# Patient Record
Sex: Female | Born: 1975
Health system: Southern US, Community
[De-identification: ages and names within clinical notes are randomized; demographics above are authoritative.]

## PROBLEM LIST (undated history)

## (undated) DIAGNOSIS — M47816 Spondylosis without myelopathy or radiculopathy, lumbar region: Secondary | ICD-10-CM

## (undated) DIAGNOSIS — T7840XA Allergy, unspecified, initial encounter: Secondary | ICD-10-CM

## (undated) DIAGNOSIS — D496 Neoplasm of unspecified behavior of brain: Secondary | ICD-10-CM

## (undated) HISTORY — DX: Allergy, unspecified, initial encounter: T78.40XA

## (undated) HISTORY — DX: Spondylosis without myelopathy or radiculopathy, lumbar region: M47.816

## (undated) HISTORY — DX: Neoplasm of unspecified behavior of brain: D49.6

---
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
## 2000-08-13 ENCOUNTER — Other Ambulatory Visit: Admission: RE | Admit: 2000-08-13 | Discharge: 2000-08-13 | Payer: Self-pay | Admitting: *Deleted

## 2001-11-21 ENCOUNTER — Other Ambulatory Visit: Admission: RE | Admit: 2001-11-21 | Discharge: 2001-11-21 | Payer: Self-pay | Admitting: Obstetrics & Gynecology

## 2003-01-03 DIAGNOSIS — D496 Neoplasm of unspecified behavior of brain: Secondary | ICD-10-CM

## 2003-01-03 HISTORY — PX: BRAIN SURGERY: SHX531

## 2003-01-03 HISTORY — DX: Neoplasm of unspecified behavior of brain: D49.6

## 2003-01-03 HISTORY — PX: OTHER SURGICAL HISTORY: SHX169

## 2007-01-03 HISTORY — PX: EXPLORATORY LAPAROTOMY: SUR591

## 2009-01-02 DIAGNOSIS — M47816 Spondylosis without myelopathy or radiculopathy, lumbar region: Secondary | ICD-10-CM

## 2009-01-02 HISTORY — DX: Spondylosis without myelopathy or radiculopathy, lumbar region: M47.816

## 2010-07-07 ENCOUNTER — Emergency Department (HOSPITAL_COMMUNITY)
Admission: EM | Admit: 2010-07-07 | Discharge: 2010-07-07 | Disposition: A | Discharge status: Home / Self Care | Payer: Self-pay | Attending: Emergency Medicine | Admitting: Emergency Medicine

## 2010-07-07 ENCOUNTER — Emergency Department (HOSPITAL_COMMUNITY): Payer: Self-pay

## 2010-07-07 DIAGNOSIS — G9389 Other specified disorders of brain: Secondary | ICD-10-CM | POA: Insufficient documentation

## 2010-07-07 DIAGNOSIS — M549 Dorsalgia, unspecified: Secondary | ICD-10-CM | POA: Insufficient documentation

## 2010-07-07 DIAGNOSIS — R51 Headache: Secondary | ICD-10-CM | POA: Insufficient documentation

## 2010-07-07 LAB — URINALYSIS, ROUTINE W REFLEX MICROSCOPIC
Bilirubin Urine: NEGATIVE
Glucose, UA: NEGATIVE mg/dL
Ketones, ur: NEGATIVE mg/dL
Leukocytes, UA: NEGATIVE
Nitrite: NEGATIVE
Protein, ur: NEGATIVE mg/dL
Specific Gravity, Urine: 1.026 (ref 1.005–1.030)
Urobilinogen, UA: 1 mg/dL (ref 0.0–1.0)
pH: 6.5 (ref 5.0–8.0)

## 2010-07-07 LAB — CBC
HCT: 36.1 % (ref 36.0–46.0)
Hemoglobin: 11.9 g/dL — ABNORMAL LOW (ref 12.0–15.0)
MCV: 80.4 fL (ref 78.0–100.0)
RDW: 15 % (ref 11.5–15.5)
WBC: 7.4 10*3/uL (ref 4.0–10.5)

## 2010-07-07 LAB — URINE MICROSCOPIC-ADD ON

## 2010-07-07 LAB — POCT I-STAT, CHEM 8
BUN: 20 mg/dL (ref 6–23)
Calcium, Ion: 1.21 mmol/L (ref 1.12–1.32)
Chloride: 111 meq/L (ref 96–112)
Creatinine, Ser: 1 mg/dL (ref 0.50–1.10)
Glucose, Bld: 89 mg/dL (ref 70–99)
HCT: 37 % (ref 36.0–46.0)
Hemoglobin: 12.6 g/dL (ref 12.0–15.0)
Potassium: 4.1 meq/L (ref 3.5–5.1)
Sodium: 139 meq/L (ref 135–145)
TCO2: 19 mmol/L (ref 0–100)

## 2010-07-07 LAB — DIFFERENTIAL
Basophils Absolute: 0 10*3/uL (ref 0.0–0.1)
Basophils Relative: 0 % (ref 0–1)
Eosinophils Absolute: 0.1 10*3/uL (ref 0.0–0.7)
Eosinophils Relative: 2 % (ref 0–5)
Lymphocytes Relative: 37 % (ref 12–46)
Lymphs Abs: 2.7 10*3/uL (ref 0.7–4.0)
Monocytes Absolute: 0.5 10*3/uL (ref 0.1–1.0)
Monocytes Relative: 7 % (ref 3–12)
Neutro Abs: 4 10*3/uL (ref 1.7–7.7)
Neutrophils Relative %: 54 % (ref 43–77)

## 2010-07-07 LAB — POCT PREGNANCY, URINE: Preg Test, Ur: NEGATIVE

## 2010-07-13 ENCOUNTER — Emergency Department (HOSPITAL_COMMUNITY)
Admission: EM | Admit: 2010-07-13 | Discharge: 2010-07-14 | Disposition: A | Discharge status: Home / Self Care | Payer: Self-pay | Attending: Emergency Medicine | Admitting: Emergency Medicine

## 2010-07-13 ENCOUNTER — Emergency Department (HOSPITAL_COMMUNITY): Payer: Self-pay

## 2010-07-13 DIAGNOSIS — M545 Low back pain, unspecified: Secondary | ICD-10-CM | POA: Insufficient documentation

## 2010-07-13 DIAGNOSIS — R51 Headache: Secondary | ICD-10-CM | POA: Insufficient documentation

## 2010-07-13 DIAGNOSIS — M5126 Other intervertebral disc displacement, lumbar region: Secondary | ICD-10-CM | POA: Insufficient documentation

## 2010-07-13 DIAGNOSIS — G9389 Other specified disorders of brain: Secondary | ICD-10-CM | POA: Insufficient documentation

## 2010-07-13 DIAGNOSIS — M542 Cervicalgia: Secondary | ICD-10-CM | POA: Insufficient documentation

## 2010-07-13 LAB — DIFFERENTIAL
Basophils Absolute: 0 10*3/uL (ref 0.0–0.1)
Basophils Relative: 0 % (ref 0–1)
Eosinophils Absolute: 0.3 10*3/uL (ref 0.0–0.7)
Eosinophils Relative: 4 % (ref 0–5)
Lymphocytes Relative: 37 % (ref 12–46)
Lymphs Abs: 3 K/uL (ref 0.7–4.0)
Monocytes Absolute: 0.6 K/uL (ref 0.1–1.0)
Monocytes Relative: 7 % (ref 3–12)
Neutro Abs: 4.1 10*3/uL (ref 1.7–7.7)
Neutrophils Relative %: 52 % (ref 43–77)

## 2010-07-13 LAB — CBC
HCT: 36 % — ABNORMAL LOW (ref 39.0–52.0)
Hemoglobin: 11.7 g/dL — ABNORMAL LOW (ref 13.0–17.0)
MCH: 26.1 pg (ref 26.0–34.0)
MCHC: 32.5 g/dL (ref 30.0–36.0)
MCV: 80.4 fL (ref 78.0–100.0)
Platelets: 259 10*3/uL (ref 150–400)
RBC: 4.48 MIL/uL (ref 4.22–5.81)
RDW: 15.3 % (ref 11.5–15.5)
WBC: 8 10*3/uL (ref 4.0–10.5)

## 2010-07-13 LAB — BASIC METABOLIC PANEL
CO2: 23 mEq/L (ref 19–32)
Calcium: 9.4 mg/dL (ref 8.4–10.5)
Glucose, Bld: 83 mg/dL (ref 70–99)
Potassium: 4.4 mEq/L (ref 3.5–5.1)
Sodium: 137 mEq/L (ref 135–145)

## 2010-07-13 LAB — BASIC METABOLIC PANEL WITH GFR
BUN: 14 mg/dL (ref 6–23)
Chloride: 105 meq/L (ref 96–112)
Creatinine, Ser: 0.78 mg/dL (ref 0.50–1.35)
GFR calc Af Amer: >60 mL/min (ref >60)
GFR calc non Af Amer: >60 mL/min (ref >60)

## 2010-07-13 MED ORDER — GADOBENATE DIMEGLUMINE 529 MG/ML IV SOLN
15.0000 mL | Freq: Once | INTRAVENOUS | Status: AC | PRN
  Administered 2010-07-13: 15 mL via INTRAVENOUS

## 2010-09-04 ENCOUNTER — Emergency Department (HOSPITAL_COMMUNITY)
Admission: EM | Admit: 2010-09-04 | Discharge: 2010-09-04 | Disposition: A | Discharge status: Home / Self Care | Payer: Self-pay | Attending: Emergency Medicine | Admitting: Emergency Medicine

## 2010-09-04 DIAGNOSIS — H6690 Otitis media, unspecified, unspecified ear: Secondary | ICD-10-CM | POA: Insufficient documentation

## 2010-09-04 DIAGNOSIS — Z85841 Personal history of malignant neoplasm of brain: Secondary | ICD-10-CM | POA: Insufficient documentation

## 2010-09-04 DIAGNOSIS — H9209 Otalgia, unspecified ear: Secondary | ICD-10-CM | POA: Insufficient documentation

## 2011-02-14 ENCOUNTER — Encounter: Payer: Self-pay | Admitting: Family Medicine

## 2011-02-14 ENCOUNTER — Ambulatory Visit (INDEPENDENT_AMBULATORY_CARE_PROVIDER_SITE_OTHER): Payer: Self-pay | Admitting: Family Medicine

## 2011-02-14 DIAGNOSIS — M5126 Other intervertebral disc displacement, lumbar region: Secondary | ICD-10-CM

## 2011-02-14 DIAGNOSIS — M545 Low back pain: Secondary | ICD-10-CM

## 2011-02-14 DIAGNOSIS — D496 Neoplasm of unspecified behavior of brain: Secondary | ICD-10-CM | POA: Insufficient documentation

## 2011-02-14 MED ORDER — TRAMADOL HCL 50 MG PO TABS: 50.0000 mg | ORAL_TABLET | Freq: Four times a day (QID) | ORAL | Status: AC | PRN

## 2011-02-14 MED ORDER — CYCLOBENZAPRINE HCL 10 MG PO TABS: 10.0000 mg | ORAL_TABLET | Freq: Three times a day (TID) | ORAL | Status: AC | PRN

## 2011-02-14 MED ORDER — PREDNISONE 10 MG PO TABS: ORAL_TABLET | ORAL | Status: DC

## 2011-02-14 NOTE — Patient Instructions (Signed)
Primary Care Providers  [facility] - [phone]  [facility]- [phone]  [facility] - [phone]

## 2011-02-14 NOTE — Progress Notes (Signed)
Patient Name: Sandra Hahn Date of Birth: 24-Aug-1975 Medical Record Number: [id number] Gender: female  PCP: No primary provider on file.  History of Present Illness:  Sandra Hahn is a 36 y.o. very pleasant female patient with a history of a cerebellar tumor dx and surgically resected in 2005 who presents with the following: Back Pain  ongoing for approximately: 3-4 years The patient has had back pain before. Ongoing problem intermittently since around 2008, with varying amount of symptoms The back pain is localized into the lumbar spine area. They also describe posterior leg radiculopathy to her foot.  Rare B foot numbness. No bowel or bladder incontinence. No focal weakness. Prior interventions: none Physical therapy: No Chiropractic manipulations: No Acupuncture: No Osteopathic manipulation: No Heat or cold: Minimal effect  Patient Active Problem List  Diagnoses  . Cerebellar tumor   Past Medical History  Diagnosis Date  . Cerebellar tumor 02/14/2011   Past Surgical History  Procedure Date  . Brain surgery 2005    Cerebellar tumor resection   History  Substance Use Topics  . Smoking status: Never Smoker   . Smokeless tobacco: Never Used  . Alcohol Use: Not on file   No family history on file. No Known Allergies No current outpatient prescriptions on file prior to visit.     Past Medical History, Surgical History, Family History, Medications, Allergies have been reviewed and updated if relevant.  Review of Systems  GEN: No fevers, chills. Nontoxic. Primarily MSK c/o today. MSK: Detailed in the HPI GI: tolerating PO intake without difficulty Neuro: As above  Otherwise the pertinent positives of the ROS are noted above.    Physical Exam  Filed Vitals:   02/14/11 1407  BP: 111/77  Pulse: 83  Height: 5\' 6"  (1.676 m)  Weight: 160 lb (72.576 kg)    Gen: Well-developed,well-nourished,in no acute distress; alert,appropriate and cooperative throughout  examination HEENT: Normocephalic and atraumatic without obvious abnormalities.  Ears, externally no deformities Pulm: Breathing comfortably in no respiratory distress Range of motion at  the waist: Flexion, rotation and lateral bending: the patient is able to flex approximately 50 at the waist. Mild impairment with extension. Lateral bending approximately 35 to the RIGHT and to the LEFT.  No echymosis or edema Rises to examination table with no difficulty Gait: minimally antalgic  Inspection/Deformity: No abnormality Paraspinus T:  Mild tenderness  B Ankle Dorsiflexion (L5,4): 5/5 B Great Toe Dorsiflexion (L5,4): 5/5 Heel Walk (L5): WNL Toe Walk (S1): WNL Rise/Squat (L4): WNL, mild pain  SENSORY B Medial Foot (L4): WNL B Dorsum (L5): WNL B Lateral (S1): WNL Light Touch: WNL Pinprick: WNL  REFLEXES Knee (L4): 2+ Ankle (S1): 2+  B SLR, seated: neg B SLR, supine: neg B FABER: neg B Reverse FABER: neg B Greater Troch: NT B Log Roll: neg B Stork: NT B Sciatic Notch: NT  Clinical Data:  History of cerebellar tumor.  Weakness.  New onset of headache and dizziness.  Pressure feeling in back of head and pain throughout the entire spine.   MRI HEAD WITHOUT AND WITH CONTRAST   Technique:  Multiplanar, multiecho pulse sequences of the brain and surrounding structures were obtained without and with intravenous contrast.   Contrast: 15 ml Multihance.   Comparison:  None.   Findings:  The patient is status post suboccipital craniotomy for resection of the right cerebellar tumor.  Right cerebellar encephalomalacia is noted.  No pathologic enhancement is present to suggest residual or recurrent tumor.  No significant supratentorial enhancement  is present.  No hemorrhage or mass lesion is present. There is no acute infarct.   Flow is present in the major intracranial arteries.  The globes orbits are intact.  The paranasal sinuses and mastoid air cells are clear.     IMPRESSION:   1.  Encephalomalacia of the right cerebellum, resection of tumor. 2.  No evidence for residual or recurrent tumor. 3.  No other acute abnormality.   *RADIOLOGY REPORT*   Clinical Data:  Weakness.  Status post resection of cerebellar tumor.  New onset headache and dizziness.  Pressure to the back of head.  Pain throughout the entire spine.   MRI CERVICAL, THORACIC AND LUMBAR SPINE WITHOUT AND WITH CONTRAST   Technique:  Multiplanar and multiecho pulse sequences of the cervical spine, to include the craniocervical junction and cervicothoracic junction, and thoracic and lumbar spine, were obtained without and with intravenous contrast.   Contrast: 15 ml Multihance.   Comparison:  None.   MRI CERVICAL SPINE   Findings:  Normal signal is present in the thoracic spinal cord. Marrow signal, vertebral body heights, and alignment are normal. The postcontrast images demonstrate no pathologic enhancement.  No significant disc herniation or stenosis is present.   IMPRESSION: Negative cervical spine.   MRI THORACIC SPINE   Findings: Normal signal is present in the thoracic spinal cord. There is fatty infiltration along the inferior aspect of T11 without to associated enhancement. Marrow signal, vertebral body heights, and alignment are otherwise normal.  No significant disc herniation or focal stenosis is present.  The postcontrast images demonstrate no pathologic enhancement.   IMPRESSION: Negative thoracic spine.   MRI LUMBAR SPINE   Findings: Normal signal is present in the conus medullaris which terminates at L1, within normal limits.  Marrow signal, vertebral body heights, and alignment are normal.  A left paracentral disc protrusion is present at L5-S1.  This may contact the  S1 nerve root bilaterally, more likely on the left.  The foramina are patent.  The disc levels at L4-5 and above are normal.  No other significant stenosis is present.   Post  contrast images demonstrate no pathologic enhancement.   IMPRESSION:   1.  Left paracentral disc protrusion at L5-S1 with potential contact of the S1 nerve roots bilaterally, more likely on the left. 2.  Otherwise normal MRI of the lumbar spine.   Original Report Authenticated By: Jamesetta Orleans. MATTERN, M.D.   Assessment and Plan:  Back pain with radiculopathy  1. Lumbar disc herniation  predniSONE (DELTASONE) 10 MG tablet, cyclobenzaprine (FLEXERIL) 10 MG tablet, traMADol (ULTRAM) 50 MG tablet, Ambulatory referral to Physical Therapy  2. Low back pain  predniSONE (DELTASONE) 10 MG tablet, cyclobenzaprine (FLEXERIL) 10 MG tablet, traMADol (ULTRAM) 50 MG tablet, Ambulatory referral to Physical Therapy  3. Cerebellar tumor      Long burst and taper of prednisone. Flexeril at night. Tramadol p.r.n. Pain.  The patient is going to obtain her [facility] card, and then we can initiate physical therapy.  She certainly has a lumbar spine disc herniation. Visualize the lumbar spine MRI at the L5-S1 level.

## 2011-03-07 ENCOUNTER — Ambulatory Visit: Payer: Self-pay | Attending: Sports Medicine | Admitting: Physical Therapy

## 2011-03-07 DIAGNOSIS — M545 Low back pain, unspecified: Secondary | ICD-10-CM | POA: Insufficient documentation

## 2011-03-07 DIAGNOSIS — IMO0001 Reserved for inherently not codable concepts without codable children: Secondary | ICD-10-CM | POA: Insufficient documentation

## 2011-03-14 ENCOUNTER — Ambulatory Visit: Payer: Self-pay | Admitting: Physical Therapy

## 2011-03-16 ENCOUNTER — Ambulatory Visit: Payer: Self-pay | Admitting: Physical Therapy

## 2011-03-21 ENCOUNTER — Ambulatory Visit: Payer: Self-pay | Admitting: Physical Therapy

## 2011-03-23 ENCOUNTER — Ambulatory Visit: Payer: Self-pay | Admitting: Physical Therapy

## 2011-03-27 ENCOUNTER — Ambulatory Visit: Payer: Self-pay | Admitting: Rehabilitation

## 2011-03-28 ENCOUNTER — Encounter: Payer: Self-pay | Admitting: Physical Therapy

## 2011-03-30 ENCOUNTER — Encounter: Payer: Self-pay | Admitting: Physical Therapy

## 2011-04-04 ENCOUNTER — Ambulatory Visit: Payer: Self-pay | Attending: Sports Medicine | Admitting: Physical Therapy

## 2011-04-04 DIAGNOSIS — M545 Low back pain, unspecified: Secondary | ICD-10-CM | POA: Insufficient documentation

## 2011-04-04 DIAGNOSIS — IMO0001 Reserved for inherently not codable concepts without codable children: Secondary | ICD-10-CM | POA: Insufficient documentation

## 2011-04-06 ENCOUNTER — Ambulatory Visit: Payer: Self-pay | Admitting: Physical Therapy

## 2011-04-12 ENCOUNTER — Ambulatory Visit: Payer: Self-pay | Admitting: Physical Therapy

## 2011-04-18 ENCOUNTER — Ambulatory Visit: Payer: Self-pay | Admitting: Physical Therapy

## 2011-04-26 ENCOUNTER — Encounter: Payer: Self-pay | Admitting: Physical Therapy

## 2011-05-11 ENCOUNTER — Encounter (HOSPITAL_COMMUNITY): Payer: Self-pay | Admitting: *Deleted

## 2011-05-11 ENCOUNTER — Emergency Department (INDEPENDENT_AMBULATORY_CARE_PROVIDER_SITE_OTHER)
Admission: EM | Admit: 2011-05-11 | Discharge: 2011-05-11 | Disposition: A | Discharge status: Home / Self Care | Payer: Self-pay | Source: Home / Self Care | Attending: Family Medicine | Admitting: Family Medicine

## 2011-05-11 DIAGNOSIS — J302 Other seasonal allergic rhinitis: Secondary | ICD-10-CM

## 2011-05-11 DIAGNOSIS — J309 Allergic rhinitis, unspecified: Secondary | ICD-10-CM

## 2011-05-11 MED ORDER — FEXOFENADINE HCL 180 MG PO TABS: 180.0000 mg | ORAL_TABLET | Freq: Every day | ORAL | Status: DC

## 2011-05-11 MED ORDER — METHYLPREDNISOLONE ACETATE 80 MG/ML IJ SUSP
INTRAMUSCULAR | Status: AC
  Filled 2011-05-11: qty 1

## 2011-05-11 MED ORDER — METHYLPREDNISOLONE ACETATE 40 MG/ML IJ SUSP
80.0000 mg | Freq: Once | INTRAMUSCULAR | Status: AC
  Administered 2011-05-11: 80 mg via INTRAMUSCULAR

## 2011-05-11 MED ORDER — FLUTICASONE PROPIONATE 50 MCG/ACT NA SUSP: 1.0000 | Freq: Two times a day (BID) | NASAL | Status: DC

## 2011-05-11 MED ORDER — OLOPATADINE HCL 0.2 % OP SOLN: 1.0000 [drp] | Freq: Two times a day (BID) | OPHTHALMIC | Status: DC

## 2011-05-11 NOTE — ED Notes (Signed)
States when she takes Zyrtec she feels better

## 2011-05-11 NOTE — ED Notes (Signed)
Pt states for about a month with sinus pressure,headache, scratchy throat, eyes itchy and feels like sand is in her eyes.  States she feels achy and tired at night.

## 2011-05-11 NOTE — ED Provider Notes (Signed)
History     CSN: [id number]  Arrival date & time 05/11/11  1504   First MD Initiated Contact with Patient 05/11/11 1610      Chief Complaint  Patient presents with  . Allergies    (Consider location/radiation/quality/duration/timing/severity/associated sxs/prior treatment) Patient is a 36 y.o. female presenting with URI. The history is provided by the patient.  URI The primary symptoms include cough. Primary symptoms do not include fever, wheezing, abdominal pain, nausea, vomiting or rash. The current episode started more than 1 week ago. This is a new problem. The problem has been gradually worsening.  Associated with: seasonal weather change. Symptoms associated with the illness include congestion and rhinorrhea. The illness is not associated with chills.    Past Medical History  Diagnosis Date  . Cerebellar tumor 02/14/2011    Past Surgical History  Procedure Date  . Brain surgery 2005    Cerebellar tumor resection    No family history on file.  History  Substance Use Topics  . Smoking status: Former Games developer  . Smokeless tobacco: Never Used  . Alcohol Use: No    OB History    Grav Para Term Preterm Abortions TAB SAB Ect Mult Living                  Review of Systems  Constitutional: Negative for fever and chills.  HENT: Positive for congestion, rhinorrhea and postnasal drip.   Respiratory: Positive for cough. Negative for wheezing.   Gastrointestinal: Negative for nausea, vomiting and abdominal pain.  Skin: Negative for rash.    Allergies  Review of patient's allergies indicates no known allergies.  Home Medications   Current Outpatient Rx  Name Route Sig Dispense Refill  . CETIRIZINE HCL 10 MG PO TABS Oral Take 10 mg by mouth daily.    Marland Kitchen PRESCRIPTION MEDICATION  1 day or 1 dose.    Marland Kitchen FEXOFENADINE HCL 180 MG PO TABS Oral Take 1 tablet (180 mg total) by mouth daily. 30 tablet 1  . FLUTICASONE PROPIONATE 50 MCG/ACT NA SUSP Nasal Place 1 spray into the  nose 2 (two) times daily. 1 g 2  . PROVERA PO Oral Take 1 tablet by mouth daily.    Marland Kitchen NAPROXEN SODIUM 220 MG PO TABS Oral Take 220 mg by mouth 2 (two) times daily as needed.    . OLOPATADINE HCL 0.2 % OP SOLN Ophthalmic Apply 1 drop to eye 2 (two) times daily. 2.5 mL 0  . PREDNISONE 10 MG PO TABS  4 tabs po daily x 6 days, then 3 tabs po daily x 4 days, then 2 tabs po daily x 4 days, then 1 tab po daily x 4 days 48 tablet 0    BP 117/76  Pulse 92  Temp(Src) 98.3 F (36.8 C) (Oral)  SpO2 100%  LMP 04/25/2011  Physical Exam  Nursing note and vitals reviewed. Constitutional: She is oriented to person, place, and time. She appears well-developed and well-nourished.  HENT:  Right Ear: External ear normal.  Left Ear: External ear normal.  Nose: Mucosal edema and rhinorrhea present.  Mouth/Throat: Oropharynx is clear and moist.  Eyes: Conjunctivae and EOM are normal. Pupils are equal, round, and reactive to light.  Neck: Normal range of motion. Neck supple.  Cardiovascular: Normal rate, regular rhythm, normal heart sounds and intact distal pulses.   Pulmonary/Chest: Breath sounds normal.  Lymphadenopathy:    She has no cervical adenopathy.  Neurological: She is alert and oriented to person, place, and  time.  Skin: Skin is warm and dry.    ED Course  Procedures (including critical care time)  Labs Reviewed - No data to display No results found.   1. Seasonal allergies       MDM          Linna Hoff, MD 05/11/11 [phone]

## 2011-07-04 ENCOUNTER — Ambulatory Visit (INDEPENDENT_AMBULATORY_CARE_PROVIDER_SITE_OTHER): Payer: Self-pay | Admitting: Family Medicine

## 2011-07-04 ENCOUNTER — Encounter: Payer: Self-pay | Admitting: Family Medicine

## 2011-07-04 VITALS — BP 114/78 | HR 103 | Temp 98.9°F | Ht 66.0 in | Wt 183.0 lb

## 2011-07-04 DIAGNOSIS — G44229 Chronic tension-type headache, not intractable: Secondary | ICD-10-CM

## 2011-07-04 DIAGNOSIS — H60399 Other infective otitis externa, unspecified ear: Secondary | ICD-10-CM

## 2011-07-04 DIAGNOSIS — H6092 Unspecified otitis externa, left ear: Secondary | ICD-10-CM

## 2011-07-04 DIAGNOSIS — M5126 Other intervertebral disc displacement, lumbar region: Secondary | ICD-10-CM

## 2011-07-04 DIAGNOSIS — H6090 Unspecified otitis externa, unspecified ear: Secondary | ICD-10-CM | POA: Insufficient documentation

## 2011-07-04 MED ORDER — HYDROCORTISONE-ACETIC ACID 1-2 % OT SOLN: 4.0000 [drp] | Freq: Three times a day (TID) | OTIC | Status: AC

## 2011-07-04 MED ORDER — ACETAMINOPHEN 500 MG PO TABS: 1000.0000 mg | ORAL_TABLET | Freq: Four times a day (QID) | ORAL | Status: AC | PRN

## 2011-07-04 NOTE — Patient Instructions (Addendum)
Sandra Hahn,  Thank you for coming in today.  For headache: You may treat with naproxen or tylenol Try to limit treating headaches to 2x per week.  For you back:  -continue exercises and NSAIDs.   For you ear: -pick up and use Vosol drops three times per day. Avoid q tips and placing anything else in your ear.   Please f/u within the month for your pap smear. Dr. Armen Pickup

## 2011-07-04 NOTE — Progress Notes (Signed)
Subjective:     Patient ID: Sandra Hahn, female   DOB: 06/13/1975, 36 y.o.   MRN: [id number]  HPI 36 yo F new patient originally from [place] presents to establish primary care. Her primary language is Arabic, however she speaks [place] fluently. She has lived in the [place] from 1997-2005, went back to [place] and [place] and returned to the [place] one year ago. In addition to establishing primary care she would like to discuss the following:  1. L ear aches: intermittent L ear pain with itching. Patient uses q-tips and sometimes but oil in her ear. Has had curettage of cerumen in the past which was painful. Denies fever or jaw/neck pain.  2. Low back pain: constant low back pain. Denies urinary or fecal incontinence, LE weakness, numbness or falls.  3. Headache: chronic associated with dizziness, occur ~ 3x per week. Top of head, bilateral temple and neck pain. Denies focal neuro deficit, visual changes, slurred speech or nausea.  4. Health maintenance: last pap smear in 2005. No history of abnormal pap smears.   Past Medical History  Diagnosis Date  . Cerebellar tumor 2005    s/p surgical resection and radiation in [place]  . Allergy   . Lumbar arthropathy 2011    Left paracentral disc protrusion at L5-S1 with potential contact of the S1 nerve roots bilaterally, more likely on the left   Past Surgical History  Procedure Date  . Brain surgery 2005    Cerebellar tumor resection  . Tonsillectomy 1985  . Exploratory laparotomy 2009    Laparoscopy revealed lack of ovaries bilaterally in [place]   Family History  Problem Relation Age of Onset  . Lumbar disc disease Mother   . Hypertension Father    History   Social History  . Marital Status: Married    Spouse Name: Samuella Cota     Number of Children: 0  . Years of Education: some colle   Occupational History  . UNEMPLOYED    Social History Main Topics  . Smoking status: Never Smoker   . Smokeless tobacco: Never Used  .  Alcohol Use: No  . Drug Use: No  . Sexually Active: Yes    Birth Control/ Protection: Pill   Other Topics Concern  . Not on file   Social History Narrative   Lives alone.Husband in [place] currently. Mother in [place].    Review of Systems  Neurological: Positive for dizziness, tremors, numbness and headaches. Negative for seizures, syncope, facial asymmetry, speech difficulty, weakness and light-headedness.       Numbness in hand when she sleeps on the ipsilateral side.    Objective:   Physical Exam BP 114/78  Pulse 103  Temp 98.9 F (37.2 C) (Oral)  Ht 5\' 6"  (1.676 m)  Wt 183 lb (83.008 kg)  BMI 29.54 kg/m2  LMP 06/06/2011 General appearance: alert, cooperative and no distress Head: Normocephalic, without obvious abnormality, atraumatic Eyes: conjunctivae/corneas clear. PERRL, EOM's intact. Fundi benign. Ears: normal TM and external ear canal right ear and abnormal external canal left ear - ceruminosis impacting canal, debris in canal and attempted irrigation to remove cerumen/debris without significant success.  Nose: turbinates pink, swollen Throat: lips, mucosa, and tongue normal; teeth and gums normal Lungs: clear to auscultation bilaterally Heart: regular rate and rhythm, S1, S2 normal, no murmur, click, rub or gallop Pulses: 2+ and symmetric Skin: Skin color, texture, turgor normal. No rashes or lesions Neurologic: Alert and oriented X 3, normal strength and tone. Normal  symmetric reflexes. Normal coordination and gait Back: midline tenderness at L5  Assessment and Plan:

## 2011-07-05 ENCOUNTER — Encounter: Payer: Self-pay | Admitting: Family Medicine

## 2011-07-05 DIAGNOSIS — M5136 Other intervertebral disc degeneration, lumbar region: Secondary | ICD-10-CM | POA: Insufficient documentation

## 2011-07-05 DIAGNOSIS — G44229 Chronic tension-type headache, not intractable: Secondary | ICD-10-CM | POA: Insufficient documentation

## 2011-07-05 NOTE — Assessment & Plan Note (Signed)
A: chronic low back pain. Stable. P: -reviewed s/s to prompt return care. -symptomatic treatment per AVS.

## 2011-07-05 NOTE — Progress Notes (Deleted)
  Subjective:    Patient ID: Felipe Drone, female    DOB: Oct 13, 1975, 36 y.o.   MRN: [id number]  HPI  Past Medical History  Diagnosis Date  . Cerebellar tumor 2005    s/p surgical resection and radiation in [place]  . Allergy   . Lumbar arthropathy 2011     Left paracentral disc protrusion at L5-S1 with potential   Past Surgical History  Procedure Date  . Brain surgery 2005    Cerebellar tumor resection  . Tonsillectomy 1985  . Exploratory laparotomy 2009    Laparoscopy revealed lack of ovaries bilaterally in [place]   Family History  Problem Relation Age of Onset  . Lumbar disc disease Mother   . Hypertension Father    .soci     Review of Systems     Objective:   Physical Exam        Assessment & Plan:

## 2011-07-05 NOTE — Assessment & Plan Note (Signed)
A: chronic otitis externa: appears eczematous P: acidifying-antiinflammatory ear drops with vosol. -advised against using q tip and oil.

## 2011-07-05 NOTE — Assessment & Plan Note (Signed)
P:  -treat with naproxen or tylenol -advised patient to try to limit treating headaches to 2x per week.

## 2011-08-08 ENCOUNTER — Encounter: Payer: Self-pay | Admitting: Family Medicine

## 2011-08-08 ENCOUNTER — Other Ambulatory Visit (HOSPITAL_COMMUNITY)
Admission: RE | Admit: 2011-08-08 | Discharge: 2011-08-08 | Disposition: A | Discharge status: Home / Self Care | Payer: Self-pay | Source: Ambulatory Visit | Attending: Family Medicine | Admitting: Family Medicine

## 2011-08-08 ENCOUNTER — Ambulatory Visit (INDEPENDENT_AMBULATORY_CARE_PROVIDER_SITE_OTHER): Payer: Self-pay | Admitting: Family Medicine

## 2011-08-08 VITALS — BP 106/73 | HR 86 | Temp 98.0°F | Ht 66.0 in | Wt 184.0 lb

## 2011-08-08 DIAGNOSIS — N970 Female infertility associated with anovulation: Secondary | ICD-10-CM

## 2011-08-08 DIAGNOSIS — Z124 Encounter for screening for malignant neoplasm of cervix: Secondary | ICD-10-CM

## 2011-08-08 DIAGNOSIS — J309 Allergic rhinitis, unspecified: Secondary | ICD-10-CM | POA: Insufficient documentation

## 2011-08-08 DIAGNOSIS — H6120 Impacted cerumen, unspecified ear: Secondary | ICD-10-CM

## 2011-08-08 DIAGNOSIS — Z01419 Encounter for gynecological examination (general) (routine) without abnormal findings: Secondary | ICD-10-CM | POA: Insufficient documentation

## 2011-08-08 DIAGNOSIS — B079 Viral wart, unspecified: Secondary | ICD-10-CM | POA: Insufficient documentation

## 2011-08-08 DIAGNOSIS — B078 Other viral warts: Secondary | ICD-10-CM

## 2011-08-08 MED ORDER — CIPROFLOXACIN-DEXAMETHASONE 0.3-0.1 % OT SUSP: 4.0000 [drp] | Freq: Two times a day (BID) | OTIC | Status: DC

## 2011-08-08 NOTE — Assessment & Plan Note (Signed)
A: normal exam. Pap done. P: if pap normal, repeat in 3 years.

## 2011-08-08 NOTE — Assessment & Plan Note (Addendum)
A: Improved from previous exam. Suspect allergic component.  Still with significant debris in external auditory canal s/p irrigation in office.  P: Would benefit from acidifying-antiinflammatory drops but too expensive, $90, when last prescribed.  Called patient's pharmacy, cheapest option is cotisporin at $44. Still too expensive,  Plan advised patient to continue using OTC ear wax removal drops.

## 2011-08-08 NOTE — Progress Notes (Signed)
Subjective:     Patient ID: Sandra Hahn, female   DOB: April 24, 1975, 36 y.o.   MRN: [id number]  HPI 36 yo F presents for well woman visit and to discuss the following:  1. Allergies: persistent x 4 months. Marked by dry itchy eyes, sinus pressure, clear rhinorrheas, ithcy throat and feeling shortness of breath. She admits to feeling hot and chills. She denies documented fever. She admits to poor sleep over the last 4 months. She take zyrtec and Flonase as needed but not daily .  2. Possible lack of ovaries: she had an exploratory laparoscopy  In [place]. She was told at teh age of 36 that she did not have ovaries. Since then she has been on hormone therapy with oral provera and and estrogen that she cannot recall. Menarches age 15. Menses q month. Never been pregnant.   3. Bump on head: she picks it off and it comes back. Not enlarging. Not painful.  Had some similar areas on L cheek and L temple.   4. Health maintenance: last pap smear in 2006. No history of abnormal pap smear. Does self breast exam. No family history of breast CA.   Review of Systems +: headaches, come and go.  Low back pain, no radicular symptom. L thoracic back pain.  -: CP, SOB, LE edema.     Objective:   Physical Exam BP 106/73  Pulse 86  Temp 98 F (36.7 C) (Oral)  Ht 5\' 6"  (1.676 m)  Wt 184 lb (83.462 kg)  BMI 29.70 kg/m2  LMP 07/12/2011 General appearance: alert, cooperative and no distress Head: Normocephalic, without obvious abnormality, atraumatic Eyes: conjunctivae/corneas clear. PERRL, EOM's intact.  Ears: normal TM and external ear canal right ear and abnormal external canal left ear -  Hard cerumen removed by irrigation and normal TM noted after wax removal but persistent hard cerumen.  Nose: Nares normal. Septum midline. Mucosa normal. No drainage or sinus tenderness., turbinates pink and swollen.  Throat: lips, mucosa, and tongue normal; teeth and gums normal Neck: no adenopathy, no carotid bruit, no  JVD, supple, symmetrical, trachea midline and thyroid not enlarged, symmetric, no tenderness/mass/nodules Lungs: clear to auscultation bilaterally Heart: regular rate and rhythm, S1, S2 normal, no murmur, click, rub or gallop Pelvic: cervix normal in appearance, external genitalia normal, no adnexal masses or tenderness, no cervical motion tenderness, uterus normal size, shape, and consistency and vagina normal without discharge Extremities: extremities normal, atraumatic, no cyanosis or edema Skin: Skin color, texture, turgor normal. No rashes or lesions or single papule R head, with scaly top, consistent with verruca vulgaris.  Neurologic: Grossly normal   Assessment and Plan:

## 2011-08-08 NOTE — Patient Instructions (Addendum)
Mrs. Gutkowski,  Thank you for coming in today.  Please continue your medication regimen: 1. For allergies: flonase and zyrtec every day.   I have ordered a pelvic ultrasound to evaluate your ovaries. My nurses will be in contact with your regarding this.   Dr. Armen Pickup

## 2011-08-08 NOTE — Assessment & Plan Note (Signed)
A: on hormone replacement since age 36 per patient. Provera + estrogen of some sort. P: -evaluate for ovaries/need for hormone therapy with a pelvic ultrasound.

## 2011-08-08 NOTE — Assessment & Plan Note (Signed)
A: persistent, symptomatic .  P:  patient to increase zyrtec and flonase to daily use. F/u as needed for worsening symptoms or fever.

## 2011-08-08 NOTE — Assessment & Plan Note (Signed)
Will monitor. Small and covered by hair. Will do shave biopsy if becomes enlarge, irritated, unsightly.

## 2011-08-11 ENCOUNTER — Ambulatory Visit (HOSPITAL_COMMUNITY)
Admission: RE | Admit: 2011-08-11 | Discharge: 2011-08-11 | Disposition: A | Discharge status: Home / Self Care | Payer: Self-pay | Source: Ambulatory Visit | Attending: Family Medicine | Admitting: Family Medicine

## 2011-08-11 ENCOUNTER — Other Ambulatory Visit: Payer: Self-pay | Admitting: Family Medicine

## 2011-08-11 DIAGNOSIS — Z79890 Hormone replacement therapy (postmenopausal): Secondary | ICD-10-CM | POA: Insufficient documentation

## 2011-08-11 DIAGNOSIS — N979 Female infertility, unspecified: Secondary | ICD-10-CM | POA: Insufficient documentation

## 2011-08-11 DIAGNOSIS — N970 Female infertility associated with anovulation: Secondary | ICD-10-CM

## 2011-08-11 MED ORDER — ESTROGENS CONJUGATED 0.3 MG PO TABS: 0.3000 mg | ORAL_TABLET | Freq: Every day | ORAL | Status: DC

## 2011-08-11 NOTE — Telephone Encounter (Signed)
1. Normal pap. Repeat in 3 years. 2. Ultrasound confirmed streak ovaries. Continue premarin and provera.   Patient had no questions.

## 2011-08-17 ENCOUNTER — Ambulatory Visit (INDEPENDENT_AMBULATORY_CARE_PROVIDER_SITE_OTHER): Payer: Self-pay | Admitting: Family Medicine

## 2011-08-17 ENCOUNTER — Encounter: Payer: Self-pay | Admitting: Family Medicine

## 2011-08-17 VITALS — BP 104/65 | HR 84 | Temp 98.3°F | Wt 184.3 lb

## 2011-08-17 DIAGNOSIS — D496 Neoplasm of unspecified behavior of brain: Secondary | ICD-10-CM

## 2011-08-17 DIAGNOSIS — H60399 Other infective otitis externa, unspecified ear: Secondary | ICD-10-CM

## 2011-08-17 DIAGNOSIS — Q969 Turner's syndrome, unspecified: Secondary | ICD-10-CM

## 2011-08-17 DIAGNOSIS — H6090 Unspecified otitis externa, unspecified ear: Secondary | ICD-10-CM

## 2011-08-17 NOTE — Patient Instructions (Addendum)
Sandra Hahn,  Thank you for coming back today. 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 You should continue hormone replacement therapy until age 36.   Climen is similar to premarin but it does also contain some progesterone in the white pills.   Dr. Armen Pickup

## 2011-08-17 NOTE — Assessment & Plan Note (Signed)
A: unknown karyotype. No complications.  P: Continue HRT until age 36.  I wonder if karyotype should be obtained in order to risk stratify ( Increased for heart disease in Turners Syndrome) and to help determine if some of her other medical problems (history of cerebellar tumor) is due to some type of congenital syndrome.  Discussed with Dr. Earnest Bailey who recommended I contact a geneticist for informal consult.

## 2011-08-17 NOTE — Assessment & Plan Note (Signed)
A: improving P: f/u as needed

## 2011-08-17 NOTE — Progress Notes (Signed)
Subjective:     Patient ID: Sandra Hahn, female   DOB: 1975/04/21, 36 y.o.   MRN: [id number]  HPI 36 yo presents for f/u to discuss the following:  1. Pelvic ultrasound: patient received results letter. Confirmed streak ovaries bilaterally. Normal uterus and cervix. Patient continues hormone replacement therapy which she has used since age 36. She takes Climen (11 tablets of 2mg  estradiol-17-valerate and 10 tablets of 2mg  estradiol-17-valerate plus 1mg  cyproterone acetate) and Provera sent to her from her husband in [place].   2. R ear ache: improved no pain. No drainage.   3. Memory problem: she feels that since her cerebellar tumor removal her cognition has been slowed. She processes and expresses information slower than she used to. She is still able to function independently. 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 She was previously studying pre pharmacy but swtiched after her surgery. She would like more time for her exams.   Review of Systems As per HPI     Objective:   Physical Exam BP 104/65  Pulse 84  Temp 98.3 F (36.8 C) (Oral)  Wt 184 lb 4.8 oz (83.598 kg)  LMP 07/12/2011 General appearance: alert, cooperative and no distress Ears: normal TM's R side. L TM obscured by brownish debris. Debris cleared partially by curette.  Neurologic: Grossly normal    Assessment and Plan:

## 2011-08-17 NOTE — Assessment & Plan Note (Signed)
A: s/p resection no physical or balance deficits. Patient reports decreased in cognitive performance.  P: provided patient with requested letter. Monitor as needed for neurological changes.

## 2011-08-23 ENCOUNTER — Telehealth: Payer: Self-pay | Admitting: Family Medicine

## 2011-08-23 DIAGNOSIS — Q969 Turner's syndrome, unspecified: Secondary | ICD-10-CM

## 2011-08-23 NOTE — Telephone Encounter (Signed)
Ordered geneticist referral. See message note below.  Per Dr. 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 I called Burna Mortimer at number provided and left VM about referral.     Hi I do recommend a karyotype (peripheral blood). I notice she is 5'6 " tall which is not typical for Turner syndrome. However, there is a possibility of gonadal mosaicism. You can refer to our clinic through [facility] [phone]. Iwould send the karyotype to [facility] and request a mosaicism study. I could handle that if desired. Thanks Pam Reitnauer  [phone]   ----- Message ----- From: Dessa Phi, MD Sent: 08/17/2011 11:59 AM To: Link Snuffer, MD Hello Dr. Erik Obey, Would you mind reviewing this patient's chart and letting me know if you recommend karyotyping? She is a new patient to me for the past 6 week. She was born in [place] but also lived in [place]. 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 She has been on hormone replacement therapy since age 70. She also had a cerebellar tumor diagnosed and resected in 2005. I question if her medical history could be attributed to some form of genetic syndrome. I look forward to your reply. Cirilo Canner

## 2011-08-25 ENCOUNTER — Telehealth: Payer: Self-pay | Admitting: Family Medicine

## 2011-08-25 NOTE — Telephone Encounter (Signed)
Patient dropped off papers to be filled out for school.  Please call her to pick up when completed.

## 2011-09-01 NOTE — Telephone Encounter (Signed)
Message left for Kaley at [phone] that form is ready to be picked up at front desk.  Ileana Ladd

## 2011-09-01 NOTE — Telephone Encounter (Signed)
Form filled out and returned to Donna.  

## 2011-09-14 ENCOUNTER — Ambulatory Visit (INDEPENDENT_AMBULATORY_CARE_PROVIDER_SITE_OTHER): Payer: Self-pay | Admitting: Family Medicine

## 2011-09-14 VITALS — BP 108/73 | HR 83 | Temp 98.2°F | Ht 66.0 in | Wt 185.0 lb

## 2011-09-14 DIAGNOSIS — D496 Neoplasm of unspecified behavior of brain: Secondary | ICD-10-CM

## 2011-09-14 DIAGNOSIS — M5126 Other intervertebral disc displacement, lumbar region: Secondary | ICD-10-CM

## 2011-09-14 MED ORDER — TRAMADOL HCL 50 MG PO TABS: 100.0000 mg | ORAL_TABLET | Freq: Two times a day (BID) | ORAL | Status: AC

## 2011-09-14 MED ORDER — PREDNISONE 10 MG PO TABS: ORAL_TABLET | ORAL | Status: DC

## 2011-09-14 MED ORDER — NAPROXEN SODIUM 220 MG PO TABS: 220.0000 mg | ORAL_TABLET | Freq: Two times a day (BID) | ORAL | Status: DC | PRN

## 2011-09-14 NOTE — Patient Instructions (Addendum)
Meisha,  Thank you for coming in to see me today.  For you back pain: you bilateral sciatica, R side worse due to your herniated disc. You also have bilateral IT band syndrome.   1. Prednisone as prescribed.  2. Tylenol 500 mg 2 tabs twice daily for moderate pain.   3. Tramadol up to twice daily for severe pain.  4. Daily back exercises from PT.  5. Weight loss: weight goas a month or so.  l of 160 lbs. Aim to lose 5 lb See me in 6 weeks or f/u. See me sooner if you develop weakness or incontinence.   Dr. Armen Pickup

## 2011-10-02 NOTE — Progress Notes (Signed)
Subjective:     Patient ID: Sandra Hahn, female   DOB: 12-May-1975, 36 y.o.   MRN: [id number]  HPI 36 yo F presents for f/u visit to discuss the following:  1. Chronic low back pain x 1 year.  She has a history of lumbar disc herniation found on MRI done on 07/13/10. She has one visit with Dr. Dallas Schimke (sports medicine) on 02/14/11. She was treated with prednisone taper, flexeril and tramadol as well as PT which helped her symptoms for one month. She now reports low back pain as 6/10. The pain is achy. Worse with prolonged sitting. Better with movement. She takes tylenol for the pain. She continues to perform the physical therapy exercises that she learned at PT.   Review of Systems As per HPI     Objective:   Physical Exam BP 108/73  Pulse 83  Temp 98.2 F (36.8 C) (Oral)  Ht 5\' 6"  (1.676 m)  Wt 185 lb (83.915 kg)  BMI 29.86 kg/m2 General appearance: alert, cooperative and no distress Back Exam: Inspection: normal Motion: full  SLR seated:       +                   SLR lying:  + XSLR seated:     +                 XSLR lying: + Seated HS Flexibility: full Palpable tenderness: midline at L3, bilateral paraspinal at L5, and L piriformis, bilateral IT band FABER: - Sensory change: neg  Reflex change: neg   Strength at foot Plantar-flexion:  5/ 5    Dorsi-flexion: 5 / 5    Eversion: 5 / 5   Inversion:  / 5 Leg strength Quad: 5 /5    Hamstring:  5/ 5   Hip flexor: 5 / 5   Hip abductors:  5/ 5 Gait Walking: normal          Heels:  normal         Toes:  normal          Tandem: normal    MRI spine 07/13/10:  Left paracentral disc protrusion at L5-S1 with potential  contact of the S1 nerve roots bilaterally, more likely on the left. Otherwise normal MRI of the lumbar spine.     Assessment and Plan:

## 2011-10-07 ENCOUNTER — Encounter: Payer: Self-pay | Admitting: Family Medicine

## 2011-10-07 NOTE — Assessment & Plan Note (Signed)
A: bilateral sciatica, R side worse due to your herniated disc. You also have bilateral IT band syndrome.  P:  1. Prednisone taper as prescribed.  2. Tylenol 500 mg 2 tabs twice daily for moderate pain.   3. Tramadol up to twice daily for severe pain.  4. Daily back exercises from PT.  5. Weight loss.  See me in 6 weeks or f/u. See me sooner if you develop weakness or incontinence.

## 2011-11-20 ENCOUNTER — Encounter (HOSPITAL_COMMUNITY): Payer: Self-pay | Admitting: Adult Health

## 2011-11-20 ENCOUNTER — Emergency Department (HOSPITAL_COMMUNITY)
Admission: EM | Admit: 2011-11-20 | Discharge: 2011-11-20 | Disposition: A | Discharge status: Home / Self Care | Payer: Self-pay | Attending: Emergency Medicine | Admitting: Emergency Medicine

## 2011-11-20 ENCOUNTER — Emergency Department (HOSPITAL_COMMUNITY): Payer: Self-pay

## 2011-11-20 DIAGNOSIS — R296 Repeated falls: Secondary | ICD-10-CM | POA: Insufficient documentation

## 2011-11-20 DIAGNOSIS — Z8739 Personal history of other diseases of the musculoskeletal system and connective tissue: Secondary | ICD-10-CM | POA: Insufficient documentation

## 2011-11-20 DIAGNOSIS — Y939 Activity, unspecified: Secondary | ICD-10-CM | POA: Insufficient documentation

## 2011-11-20 DIAGNOSIS — Y929 Unspecified place or not applicable: Secondary | ICD-10-CM | POA: Insufficient documentation

## 2011-11-20 DIAGNOSIS — S62639A Displaced fracture of distal phalanx of unspecified finger, initial encounter for closed fracture: Secondary | ICD-10-CM | POA: Insufficient documentation

## 2011-11-20 DIAGNOSIS — Z85841 Personal history of malignant neoplasm of brain: Secondary | ICD-10-CM | POA: Insufficient documentation

## 2011-11-20 DIAGNOSIS — Z79899 Other long term (current) drug therapy: Secondary | ICD-10-CM | POA: Insufficient documentation

## 2011-11-20 DIAGNOSIS — S62501A Fracture of unspecified phalanx of right thumb, initial encounter for closed fracture: Secondary | ICD-10-CM

## 2011-11-20 MED ORDER — OXYCODONE-ACETAMINOPHEN 5-325 MG PO TABS: 1.0000 | ORAL_TABLET | ORAL | Status: DC | PRN

## 2011-11-20 MED ORDER — OXYCODONE-ACETAMINOPHEN 5-325 MG PO TABS
1.0000 | ORAL_TABLET | Freq: Once | ORAL | Status: AC
  Administered 2011-11-20: 1 via ORAL
  Filled 2011-11-20: qty 1

## 2011-11-20 NOTE — ED Notes (Signed)
Slipped in the shower and fell and caught self with right hand and right thumb. Right thumb edema and pain. CMS intact.

## 2011-11-20 NOTE — Progress Notes (Signed)
Orthopedic Tech Progress Note Patient Details:  Sandra Hahn [birth year] [id number]  Ortho Devices Type of Ortho Device: Finger splint Ortho Device/Splint Location: (R) UE Ortho Device/Splint Interventions: Application   Jennye Moccasin 11/20/2011, 10:03 PM

## 2011-11-20 NOTE — ED Provider Notes (Signed)
History   This chart was scribed for Loren Racer, MD by Gerlean Ren, ED Scribe. This patient was seen in room TR11C/TR11C and the patient's care was started at 8:04 PM    CSN: [id number]  Arrival date & time 11/20/11  4098   First MD Initiated Contact with Patient 11/20/11 1946      Chief Complaint  Patient presents with  . Hand Injury     The history is provided by the patient. No language interpreter was used.   Sandra Hahn is a 36 y.o. female who presents to the Emergency Department complaining of constant, gradually worsening, non-radiating right hand and thumb pain with sudden onset when pt fell in the shower and braced her fall with right hand.  Pt denies numbness and tingling.  Pt denies any further injuries as a result of the fall.  Pt has h/o cerebellar tumor.  Pt denies tobacco and alcohol use.   Past Medical History  Diagnosis Date  . Cerebellar tumor 2005    s/p surgical resection and radiation in [place]  . Allergy   . Lumbar arthropathy 2011    Left paracentral disc protrusion at L5-S1 with potential contact of the S1 nerve roots bilaterally, more likely on the left    Past Surgical History  Procedure Date  . Tonsillectomy 1985  . Exploratory laparotomy 2009    Laparoscopy revealed lack of ovaries bilaterally in [place]  . Brain surgery 2005    Cerebellar tumor resection  . Cerebellar tumor 2005    Dx and resected in 2005    Family History  Problem Relation Age of Onset  . Lumbar disc disease Mother   . Hypertension Father   . Diabetes Neg Hx   . Cancer Neg Hx   . Birth defects Neg Hx     History  Substance Use Topics  . Smoking status: Never Smoker   . Smokeless tobacco: Never Used  . Alcohol Use: No    No OB history provided.  Review of Systems  Musculoskeletal:       Right hand pain  Neurological: Negative for weakness and numbness.    Allergies  Review of patient's allergies indicates no known allergies.  Home Medications     Current Outpatient Rx  Name  Route  Sig  Dispense  Refill  . ACETAMINOPHEN 500 MG PO TABS   Oral   Take 500 mg by mouth daily.         Marland Kitchen CETIRIZINE HCL 10 MG PO TABS   Oral   Take 10 mg by mouth daily.         Marland Kitchen FLUTICASONE PROPIONATE 50 MCG/ACT NA SUSP   Nasal   Place 1 spray into the nose 2 (two) times daily.   1 g   2   . OXYCODONE-ACETAMINOPHEN 5-325 MG PO TABS   Oral   Take 1 tablet by mouth every 4 (four) hours as needed for pain.   15 tablet   0     BP 116/73  Pulse 95  Temp 98.5 F (36.9 C) (Oral)  Resp 16  SpO2 100%  Physical Exam  Nursing note and vitals reviewed. Constitutional: She is oriented to person, place, and time. She appears well-developed and well-nourished.  HENT:  Head: Normocephalic and atraumatic.  Eyes: Conjunctivae normal and EOM are normal. Pupils are equal, round, and reactive to light.  Neck: Normal range of motion. Neck supple.       No midline c-spine tenderness.  Cardiovascular:  Normal rate, regular rhythm and normal heart sounds.   Pulmonary/Chest: Effort normal and breath sounds normal.  Abdominal: Bowel sounds are normal.  Musculoskeletal: Normal range of motion.       Tender to palpation over right proximal phalanx with mild swelling over dorsal surface.  Right wrist ROM normal.  Good cap refill.  Sensation intact.  Right snuff box normal.  Neurological: She is alert and oriented to person, place, and time.       Neurological exam intact.  Skin: Skin is warm and dry.  Psychiatric: She has a normal mood and affect.    ED Course  Procedures (including critical care time) DIAGNOSTIC STUDIES: Oxygen Saturation is 100% on room air, normal by my interpretation.    COORDINATION OF CARE: 8:07 PM- Patient informed of clinical course, understands medical decision-making process, and agrees with plan.  Ordered right hand XR, ice, and oxycodone.        Labs Reviewed - No data to display Dg Hand Complete  Right  11/20/2011  *RADIOLOGY REPORT*  Clinical Data: Post fall  RIGHT HAND - COMPLETE 3+ VIEW  Comparison: None  Findings: Transverse fracture through proximal to mid portion of the distal phalanx of the right thumb, with a comminuted, minimally displaced. No definite articular extension. No additional fracture, dislocation, or bone destruction.  IMPRESSION: Fracture distal phalanx right thumb as above.   Original Report Authenticated By: Ulyses Southward, M.D.      1. Closed fracture of phalanx of right thumb       MDM  I personally performed the services described in this documentation, which was scribed in my presence. The recorded information has been reviewed and is accurate.       Loren Racer, MD 11/20/11 2139

## 2011-12-12 ENCOUNTER — Ambulatory Visit (INDEPENDENT_AMBULATORY_CARE_PROVIDER_SITE_OTHER): Payer: No Typology Code available for payment source | Admitting: Pediatrics

## 2011-12-12 VITALS — Ht 66.25 in | Wt 185.2 lb

## 2011-12-12 DIAGNOSIS — N970 Female infertility associated with anovulation: Secondary | ICD-10-CM

## 2011-12-12 DIAGNOSIS — Q969 Turner's syndrome, unspecified: Secondary | ICD-10-CM

## 2011-12-12 NOTE — Progress Notes (Signed)
[facility]ediatric [facility] [address] [place]  [place] [address] [phone] FAX [phone]  Caitrin Potash DOB: [birth year] DATE OF EVALUATION:  December 12, 2011  MEDICAL GENETICS CONSULTATION [facility]  Ruhani Quintela is a 36 y.o. referred by Dr. Dessa Phi of [facility].    This is the first [facility] Genetics evaluation for Ms. Barris.  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    A pelvic ultrasound performed 5 months ago suggested a normal uterus and cervix, but streak ovaries present. Ms. Taff takes hormone replacement therapy. There was laparoscopic surgery at [facility] in evaluation of amenorrhea/infertility over 10 years ago, but we do not have those results.    Ms. Dershem reports normal development as a child.  There is a history of excision of a cerebellar tumor in 2005.  Ms. Neer reports cognition and memory were affected for a short time after that.   Ms. Smejkal wears eyeglasses. There are seasonal allergies.   There was a fracture of the distal phalanx of the right thumb last month. A review of the radiograph of the entire right hand taken at that time shows no obvious shortened fourth metacarpal bone or other congenital bone abnormality.  There is chronic back pain and lumbar disk herniation was discovered on MRI in 2012.   Surgical history:  There is a history of a tonsillectomy in 1985.  There was an exploratory laparotomy in 2009.   FAMILY HISTORY:  Ms. Crass reported a one-year-old niece with congenital heart disease requiring two surgical corrections.  Her father was 6'0, had hypertension and died at 36 years of age.  Her mother is approximately 39 years old, average stature (<5'6) and has a history of lumbar disk disease.  Ms. Kniskern parents are reported to be first cousins once-removed.  Her family is [place].  The family history is otherwise remarkable for birth defects  including congenital heart disease, recurrent miscarriages, infertility, cognitive and developmental delays, and known genetic conditions.  A detailed family history can be found in the genetics chart.  Physical Examination: Height: 66.25 inches, weight 185.2 lb  Head/facies    No unusual features.  Normal anterior and posterior hairline.  Well-healed surgical scar occiput, posterior neck.  Head circumference 53.4 cm (approximately 10th percentile)  Eyes Normal fundi  Ears Normally shaped  Mouth Normal palate  Neck No thyromegaly  Chest No murmur  Abdomen Non distended  Musculoskeletal Very mild 2,3 toe syndactyly (< 1/3). No obvious metacarpal or metatarsal shortening.  No valgus deformity.   Neuro No tremor, no ataxia  Skin/Integument One cafe au lait macule on neck.    ASSESSMENT:  Shakesha Soltau is a nearly 36 year old woman with gonadal dysgenesis determined by imaging and endocrinologic studies in the past.  TO our knowledge, no particular genetic testing has been performed.  Ms. Zhao has normal stature and not usual stigmata of Turner syndrome or other genetic condition.  However, one diagnostic consideration is mosaicism for Turner syndrome.  The first diagnostic test to consider would be a peripheral blood karyotype.    Genetic counselor, Zonia Kief, and I discussed the approach to genetic testing.  Given that Ms. Geigle would pay out of pocket for any testing, she is hesitant at this time to have any tests performed.  Other approaches discussed that may provide and genetic diagnosis and somewhat less expensive than a peripheral blood karyotype would be an in situ study of blood cells or  buccal smear by molecular genetic technique (fluorescence in situ hybridization) using sex chromosome probes.  We would need to provide the relative charges for those tests before Ms. Wenke would consider this approach.  We discussed that mosaicism may be difficult to elucidate as not all  tissues would reflect mosaicism.  We wonder if any studies were performed at time of laparoscopy at [facility] over 10 years ago.    RECOMMENDATIONS:  Consider further genetic testing in a tiered fashion to allow for a more economically advantageous approach 1. Peripheral blood karyotype (or) 2. Study for mosaicism (sex chromosome FISH) 3. If the blood studies are nondiagnostic, consider buccal smear with sex chromosome FISH The FISH studies would not detect a chromosome rearrangement if that was present.    Link Snuffer, M.D., Ph.D. Clinical Professor, Pediatrics and Medical Genetics  Cc: Dr. Armen Pickup

## 2011-12-20 ENCOUNTER — Ambulatory Visit: Payer: Self-pay | Admitting: Family Medicine

## 2012-01-10 ENCOUNTER — Encounter: Payer: Self-pay | Admitting: Family Medicine

## 2012-01-10 ENCOUNTER — Ambulatory Visit (INDEPENDENT_AMBULATORY_CARE_PROVIDER_SITE_OTHER): Payer: No Typology Code available for payment source | Admitting: Family Medicine

## 2012-01-10 VITALS — BP 111/74 | HR 94 | Temp 98.8°F | Ht 66.25 in | Wt 185.0 lb

## 2012-01-10 DIAGNOSIS — Z23 Encounter for immunization: Secondary | ICD-10-CM

## 2012-01-10 DIAGNOSIS — J329 Chronic sinusitis, unspecified: Secondary | ICD-10-CM

## 2012-01-10 DIAGNOSIS — B079 Viral wart, unspecified: Secondary | ICD-10-CM

## 2012-01-10 DIAGNOSIS — B078 Other viral warts: Secondary | ICD-10-CM

## 2012-01-10 MED ORDER — DOXYCYCLINE HYCLATE 100 MG PO TABS: 100.0000 mg | ORAL_TABLET | Freq: Two times a day (BID) | ORAL | Status: DC

## 2012-01-10 NOTE — Patient Instructions (Addendum)
Moon,   Thank you for coming in today.   It appears that you have sinusitis. For this please take doxycyline 100 mg twice daily for the next 7 days.  Use nasal saline as needed to flush nose and for congestion.  Continue daily zyrtec and flonase.  Continue tylenol  1000 mg up to every 8 hrs and ibuprofen  600 mg up to every 6 hrs as needed for pain.   Dr. Armen Pickup    F/u for scalp lesion shave biopsy.

## 2012-01-10 NOTE — Assessment & Plan Note (Addendum)
A: sinusitis x 14 days. No fever. No improvement.  P:  doxycyline 100 mg twice daily for the next 7 days.  Use nasal saline as needed to flush nose and for congestion.  Continue daily zyrtec and flonase.  Continue tylenol  1000 mg up to every 8 hrs and ibuprofen  600 mg up to every 6 hrs as needed for pain.

## 2012-01-10 NOTE — Assessment & Plan Note (Signed)
A: unchanged clinically. Patient desires removal and picks at lesion P: shave biopsy at patient convenience.

## 2012-01-10 NOTE — Progress Notes (Signed)
Subjective:     Patient ID: Sandra Hahn, female   DOB: 1975/08/26, 37 y.o.   MRN: [id number]  HPI 37 yo F presents for f/u visit to discuss the following:  1. Cold symptoms: x 2 weeks. Initially with sore throat that has improved. Now with congestion, maxillary face pain, body aches, feeling warm but denies documented fever. Reports headaches have been worse since cold especially, L neck pains.  No sick contacts. Taking ginger, vitamin C, zyrtec, flonase and ibuprofen. Denies cough, CP, SOB and sick contacts. Had not yet had flu shot.   2. R thumb closed fracture: 7 weeks post fall. Patient went to ED and saw ortho in f/u once. Did not go back because symptoms were improving. Still has occasional soreness.   3. Scalp lesion: r scalp. Not changing in size. Patient picks at it and would like it removed.   4. Documentation of Medical Condition Verification form: form from [facility]. Will fill out fax to [facility] and mail copy to patient.   Review of Systems As per HPI     Objective:   Physical Exam BP 111/74  Pulse 94  Temp 98.8 F (37.1 C) (Oral)  Ht 5' 6.25" (1.683 m)  Wt 185 lb (83.915 kg)  BMI 29.63 kg/m2 General appearance: alert, cooperative and no distress Head: Normocephalic, without obvious abnormality, atraumatic Eyes: conjunctivae/corneas clear. PERRL, EOM's intact.  Ears: normal TM and external ear canal right ear and abnormal external canal left ear - ceruminosis impacting canal Nose: turbinates red, swollen, inflamed Throat: lips, mucosa, and tongue normal; teeth and gums normal Neck: no adenopathy, no carotid bruit, no JVD, supple, symmetrical, trachea midline and thyroid not enlarged, symmetric, no tenderness/mass/nodules Lungs: clear to auscultation bilaterally Heart: regular rate and rhythm, S1, S2 normal, no murmur, click, rub or gallop Skin: single papule R head, with scaly top, consistent with verruca vulgaris.  Neurologic: Grossly normal     Assessment and Plan:

## 2012-06-19 ENCOUNTER — Ambulatory Visit (INDEPENDENT_AMBULATORY_CARE_PROVIDER_SITE_OTHER): Payer: No Typology Code available for payment source | Admitting: Family Medicine

## 2012-06-19 ENCOUNTER — Encounter: Payer: Self-pay | Admitting: Family Medicine

## 2012-06-19 VITALS — BP 109/75 | HR 91 | Temp 98.0°F | Wt 182.0 lb

## 2012-06-19 DIAGNOSIS — G459 Transient cerebral ischemic attack, unspecified: Secondary | ICD-10-CM | POA: Insufficient documentation

## 2012-06-19 LAB — POCT GLYCOSYLATED HEMOGLOBIN (HGB A1C): Hemoglobin A1C: 5.9

## 2012-06-19 MED ORDER — ASPIRIN EC 81 MG PO TBEC: 81.0000 mg | DELAYED_RELEASE_TABLET | Freq: Every day | ORAL | Status: DC

## 2012-06-19 NOTE — Patient Instructions (Addendum)
Thank you for coming in today. I am concerned about TIA.  Start daily aspirin. Get labs and studies.  F/u in one week.  Dr. Armen Pickup   Transient Ischemic Attack A transient ischemic attack (TIA) is a "warning stroke" that causes stroke-like symptoms. Unlike a stroke, a TIA does not cause permanent damage to the brain. The symptoms of a TIA can happen very fast and do not last long. It is important to know the symptoms of a TIA and what to do. This can help prevent a major stroke or death. CAUSES   A TIA is caused by a temporary blockage in an artery in the brain or neck (carotid artery). The blockage does not allow the brain to get the blood supply it needs and can cause different symptoms. The blockage can be caused by either:  A blood clot.  Fatty buildup (plaque) in a neck or brain artery. RISK FACTORS  High blood pressure (hypertension).  High cholesterol.  Diabetes mellitus.  Heart disease.  The build up of plaque in the blood vessels (peripheral artery disease or atherosclerosis).  The build up of plaque in the blood vessels providing blood and oxygen to the brain (carotid artery stenosis).  An abnormal heart rhythm (atrial fibrillation).  Obesity.  Smoking.  Taking oral contraceptives (especially in combination with smoking).  Physical inactivity.  A diet high in fats, salt (sodium), and calories.  Alcohol use.  Use of illegal drugs (especially cocaine and methamphetamine).  Being female.  Being African American.  Being over the age of 24.  Family history of stroke.  Previous history of blood clots, stroke, TIA, or heart attack.  Sickle cell disease. SYMPTOMS  TIA symptoms are the same as a stroke but are temporary. These symptoms usually develop suddenly, or may be newly present upon awakening from sleep:  Sudden weakness or numbness of the face, arm, or leg, especially on one side of the body.  Sudden trouble walking or difficulty moving arms or  legs.  Sudden confusion.  Sudden personality changes.  Trouble speaking (aphasia) or understanding.  Difficulty swallowing.  Sudden trouble seeing in one or both eyes.  Double vision.  Dizziness.  Loss of balance or coordination.  Sudden severe headache with no known cause.  Trouble reading or writing.  Loss of bowel or bladder control.  Loss of consciousness. DIAGNOSIS  Your caregiver may be able to determine the presence or absence of a TIA based on your symptoms, history, and physical exam. Computed tomography (CT scan) of the brain is usually performed to help identify a TIA. Other tests may be done to diagnose a TIA. These tests may include:  Electrocardiography.  Continuous heart monitoring.  Echocardiography.  Carotid ultrasonography.  Magnetic resonance imaging (MRI).  A scan of the brain circulation.  Blood tests. PREVENTION  The risk of a TIA can be decreased by appropriately treating high blood pressure, high cholesterol, diabetes, heart disease, and obesity and by quitting smoking, limiting alcohol, and staying physically active. TREATMENT  Time is of the essence. Since the symptoms of TIA are the same as a stroke, it is important to seek treatment within 3 4 hours of the start of symptoms because you may receive a medicine to dissolve the clot (thrombolytic) that cannot be given after that time. Treatment options vary. Treatment options may include rest, oxygen, intravenous (IV) fluids, and medicines to thin the blood (anticoagulants). Medicines and diet may be used to address diabetes, high blood pressure, and other risk factors. Measures  will be taken to prevent short-term and long-term complications, including infection from breathing foreign material into the lungs (aspiration pneumonia), blood clots in the legs, and falls. Treatment options include procedures to either remove plaque in the carotid arteries or dilate carotid arteries that have narrowed  due to plaque. Those procedures are:  Carotid endarterectomy.  Carotid angioplasty and stenting. HOME CARE INSTRUCTIONS   Take all medicines prescribed by your caregiver. Follow the directions carefully. Medicines may be used to control risk factors for a stroke. Be sure you understand all your medicine instructions.  You may be told to take aspirin or the anticoagulant warfarin. Warfarin needs to be taken exactly as instructed.  Taking too much or too little warfarin is dangerous. Too much warfarin increases the risk of bleeding. Too little warfarin continues to allow the risk for blood clots. While taking warfarin, you will need to have regular blood tests to measure your blood clotting time. A PT blood test measures how long it takes for blood to clot. Your PT is used to calculate another value called an INR. Your PT and INR help your caregiver to adjust your dose of warfarin. The dose can change for many reasons. It is critically important that you take warfarin exactly as prescribed.  Many foods, especially foods high in vitamin K can interfere with warfarin and affect the PT and INR. Foods high in vitamin K include spinach, kale, broccoli, cabbage, collard and turnip greens, brussels sprouts, peas, cauliflower, seaweed, and parsley as well as beef and pork liver, green tea, and soybean oil. You should eat a consistent amount of foods high in vitamin K. Avoid major changes in your diet, or notify your caregiver before changing your diet. Arrange a visit with a dietitian to answer your questions.  Many medicines can interfere with warfarin and affect the PT and INR. You must tell your caregiver about any and all medicines you take, this includes all vitamins and supplements. Be especially cautious with aspirin and anti-inflammatory medicines. Do not take or discontinue any prescribed or over-the-counter medicine except on the advice of your caregiver or pharmacist.  Warfarin can have side  effects, such as excessive bruising or bleeding. You will need to hold pressure over cuts for longer than usual. Your caregiver or pharmacist will discuss other potential side effects.  Avoid sports or activities that may cause injury or bleeding.  Be mindful when shaving, flossing your teeth, or handling sharp objects.  Alcohol can change the body's ability to handle warfarin. It is best to avoid alcoholic drinks or consume only very small amounts while taking warfarin. Notify your caregiver if you change your alcohol intake.  Notify your dentist or other caregivers before procedures.  Eat a diet that includes 5 or more servings of fruits and vegetables each day. This may reduce the risk of stroke. Certain diets may be prescribed to address high blood pressure, high cholesterol, diabetes, or obesity.  A low-sodium, low-saturated fat, low-trans fat, low-cholesterol diet is recommended to manage high blood pressure.  A low-saturated fat, low-trans fat, low-cholesterol, and high-fiber diet may control cholesterol levels.  A controlled-carbohydrate, controlled-sugar diet is recommended to manage diabetes.  A reduced-calorie, low-sodium, low-saturated fat, low-trans fat, low-cholesterol diet is recommended to manage obesity.  Maintain a healthy weight.  Stay physically active. It is recommended that you get at least 30 minutes of activity on most or all days.  Do not smoke.  Limit alcohol use even if you are not taking warfarin. Moderate  alcohol use is considered to be:  No more than 2 drinks each day for men.  No more than 1 drink each day for nonpregnant women.  Stop drug abuse.  Home safety. A safe home environment is important to reduce the risk of falls. Your caregiver may arrange for specialists to evaluate your home. Having grab bars in the bedroom and bathroom is often important. Your caregiver may arrange for equipment to be used at home, such as raised toilets and a seat for  the shower.  Follow all instructions for follow-up with your caregiver. This is very important. This includes any referrals and lab tests. Proper follow up can prevent a stroke or another TIA from occurring. SEEK MEDICAL CARE IF:  You have personality changes.  You have difficulty swallowing.  You are seeing double.  You have dizziness.  You have a fever.  You have skin breakdown. SEEK IMMEDIATE MEDICAL CARE IF:  Any of these symptoms may represent a serious problem that is an emergency. Do not wait to see if the symptoms will go away. Get medical help right away. Call your local emergency services (911 in [place] Do not drive yourself to the hospital.  You have sudden weakness or numbness of the face, arm, or leg, especially on one side of the body.  You have sudden trouble walking or difficulty moving arms or legs.  You have sudden confusion.  You have trouble speaking (aphasia) or understanding.  You have sudden trouble seeing in one or both eyes.  You have a loss of balance or coordination.  You have a sudden, severe headache with no known cause.  You have new chest pain or an irregular heartbeat.  You have a partial or total loss of consciousness. MAKE SURE YOU:   Understand these instructions.  Will watch your condition.  Will get help right away if you are not doing well or get worse. Document Released: 09/28/2004 Document Revised: 12/06/2011 Document Reviewed: 02/11/2009 [facility] Patient Information 2014 [place], [place].

## 2012-06-20 ENCOUNTER — Ambulatory Visit (HOSPITAL_COMMUNITY)
Admission: RE | Admit: 2012-06-20 | Discharge: 2012-06-20 | Disposition: A | Discharge status: Home / Self Care | Payer: No Typology Code available for payment source | Source: Ambulatory Visit | Attending: Family Medicine | Admitting: Family Medicine

## 2012-06-20 DIAGNOSIS — R42 Dizziness and giddiness: Secondary | ICD-10-CM

## 2012-06-20 DIAGNOSIS — G459 Transient cerebral ischemic attack, unspecified: Secondary | ICD-10-CM

## 2012-06-20 DIAGNOSIS — I658 Occlusion and stenosis of other precerebral arteries: Secondary | ICD-10-CM | POA: Insufficient documentation

## 2012-06-20 DIAGNOSIS — I6529 Occlusion and stenosis of unspecified carotid artery: Secondary | ICD-10-CM | POA: Insufficient documentation

## 2012-06-20 LAB — LIPID PANEL
Cholesterol: 141 mg/dL (ref 0–200)
Triglycerides: 209 mg/dL — ABNORMAL HIGH (ref <150)

## 2012-06-20 LAB — COMPREHENSIVE METABOLIC PANEL
ALT: 32 U/L (ref 0–35)
Albumin: 4 g/dL (ref 3.5–5.2)
CO2: 23 mEq/L (ref 19–32)
Calcium: 9.4 mg/dL (ref 8.4–10.5)
Chloride: 106 mEq/L (ref 96–112)
Glucose, Bld: 82 mg/dL (ref 70–99)
Sodium: 138 mEq/L (ref 135–145)
Total Protein: 7.4 g/dL (ref 6.0–8.3)

## 2012-06-20 NOTE — Progress Notes (Signed)
VASCULAR LAB PRELIMINARY  PRELIMINARY  PRELIMINARY  PRELIMINARY  Carotid Dopplers completed.    Preliminary report:  40-59% ICA stenosis.  Vertebral artery flow is antegrade.    Abbi Mancini, RVT 06/20/2012, 4:58 PM

## 2012-06-21 ENCOUNTER — Telehealth: Payer: Self-pay | Admitting: Family Medicine

## 2012-06-21 ENCOUNTER — Ambulatory Visit (HOSPITAL_COMMUNITY)
Admission: RE | Admit: 2012-06-21 | Discharge: 2012-06-21 | Disposition: A | Discharge status: Home / Self Care | Payer: No Typology Code available for payment source | Source: Ambulatory Visit | Attending: Family Medicine | Admitting: Family Medicine

## 2012-06-21 DIAGNOSIS — R209 Unspecified disturbances of skin sensation: Secondary | ICD-10-CM | POA: Insufficient documentation

## 2012-06-21 DIAGNOSIS — G459 Transient cerebral ischemic attack, unspecified: Secondary | ICD-10-CM

## 2012-06-21 DIAGNOSIS — E039 Hypothyroidism, unspecified: Secondary | ICD-10-CM | POA: Insufficient documentation

## 2012-06-21 DIAGNOSIS — Z86011 Personal history of benign neoplasm of the brain: Secondary | ICD-10-CM | POA: Insufficient documentation

## 2012-06-21 NOTE — Addendum Note (Signed)
Addended by: Dessa Phi on: 06/21/2012 02:03 PM   Modules accepted: Orders

## 2012-06-21 NOTE — Telephone Encounter (Signed)
Call patient with an overall her labs at his studies have been received and reviewed. I like her to see me in followup to discuss them. I also informed her that if ordered echo and she will be called the appointment. Also I ordered a free T4 to followup her elevated TSH she can either come before her appointment to get this done artery done at her appointment.

## 2012-06-21 NOTE — Assessment & Plan Note (Signed)
A: Transient right arm numbness concerning for TIA, the distribution of the numbness is too diffuse to be a distal neuropathy. P:  Daily aspirin CT head and carotid dopplers.  Check lipid panel, TSH and A1c.

## 2012-06-21 NOTE — Progress Notes (Signed)
Subjective:     Patient ID: Sandra Hahn, female   DOB: 1975/02/21, 37 y.o.   MRN: [id number]  HPI 37 year old female presents for follow visit to discuss the following  #1 right hand numbness: Patient experiences numbness from her right elbow to her right hand (anterior and posterior) that lasted for 15 minutes. This numbness occurred 8 days ago. It occurred while she was walking out of the office following a visit to get her orange card. There is no associated headache,  weakness or other neurologic deficits. This has not occurred since. This has never occurred before. There is no family history of stroke. She has no personal history of hypertension, diabetes, hyperlipidemia. She is a nonsmoker. There have been no recent medication changes.  Review of Systems As per HPI     Objective:   Physical Exam BP 109/75  Pulse 91  Temp(Src) 98 F (36.7 C) (Oral)  Wt 182 lb (82.555 kg)  BMI 29.15 kg/m2  LMP 06/19/2012 General appearance: alert, cooperative and no distress Neck: no adenopathy, no carotid bruit, no JVD, supple, symmetrical, trachea midline and thyroid not enlarged, symmetric, no tenderness/mass/nodules Neurologic: Alert and oriented X 3, normal strength and tone. Normal symmetric reflexes. Normal coordination and gait    Assessment and Plan:

## 2012-06-25 ENCOUNTER — Telehealth: Payer: Self-pay | Admitting: *Deleted

## 2012-06-25 NOTE — Telephone Encounter (Signed)
Attemtped to call again with same response.  Will try again tomorrow.  As below appt card mailed since unable to contact pt on several attempts. Helon Wisinski, Maryjo [place]

## 2012-06-25 NOTE — Telephone Encounter (Signed)
Attempted to contact pt about her appt for 2D echo.  No answer and no machine just recording saying "the person you are trying to reach is unavailable at this time".  Mailed an appt reminder. [place], Sandra Hahn

## 2012-07-01 ENCOUNTER — Ambulatory Visit (HOSPITAL_COMMUNITY)
Admission: RE | Admit: 2012-07-01 | Discharge: 2012-07-01 | Disposition: A | Discharge status: Home / Self Care | Payer: No Typology Code available for payment source | Source: Ambulatory Visit | Attending: Family Medicine | Admitting: Family Medicine

## 2012-07-01 DIAGNOSIS — I079 Rheumatic tricuspid valve disease, unspecified: Secondary | ICD-10-CM | POA: Insufficient documentation

## 2012-07-01 DIAGNOSIS — G459 Transient cerebral ischemic attack, unspecified: Secondary | ICD-10-CM

## 2012-07-01 DIAGNOSIS — Z8673 Personal history of transient ischemic attack (TIA), and cerebral infarction without residual deficits: Secondary | ICD-10-CM | POA: Insufficient documentation

## 2012-07-01 NOTE — Progress Notes (Signed)
Echo Lab  2D Echocardiogram completed.  Sandra Hahn L Joannah Gitlin, RDCS 07/01/2012 2:40 PM

## 2012-07-02 ENCOUNTER — Telehealth: Payer: Self-pay | Admitting: *Deleted

## 2012-07-02 NOTE — Telephone Encounter (Signed)
Pt informed of normal ECHO per MD.  Radene Ou, CMA

## 2012-07-02 NOTE — Telephone Encounter (Signed)
Message copied by Radene Ou on Tue Jul 02, 2012  4:13 PM ------      Message from: Dessa Phi      Created: Tue Jul 02, 2012  3:55 PM       Normal ECHO.      Please inform patient.        ------

## 2012-07-08 ENCOUNTER — Telehealth: Payer: Self-pay | Admitting: Family Medicine

## 2012-07-08 ENCOUNTER — Ambulatory Visit (INDEPENDENT_AMBULATORY_CARE_PROVIDER_SITE_OTHER): Payer: No Typology Code available for payment source | Admitting: Family Medicine

## 2012-07-08 ENCOUNTER — Encounter: Payer: Self-pay | Admitting: Family Medicine

## 2012-07-08 VITALS — BP 126/78 | HR 77 | Temp 97.9°F | Ht 66.25 in | Wt 181.0 lb

## 2012-07-08 DIAGNOSIS — R6889 Other general symptoms and signs: Secondary | ICD-10-CM

## 2012-07-08 DIAGNOSIS — A048 Other specified bacterial intestinal infections: Secondary | ICD-10-CM

## 2012-07-08 DIAGNOSIS — J309 Allergic rhinitis, unspecified: Secondary | ICD-10-CM

## 2012-07-08 DIAGNOSIS — R1013 Epigastric pain: Secondary | ICD-10-CM

## 2012-07-08 DIAGNOSIS — R7989 Other specified abnormal findings of blood chemistry: Secondary | ICD-10-CM | POA: Insufficient documentation

## 2012-07-08 LAB — T3, FREE: T3, Free: 2.9 pg/mL (ref 2.3–4.2)

## 2012-07-08 LAB — POCT H PYLORI SCREEN: H Pylori Screen, POC: POSITIVE

## 2012-07-08 MED ORDER — FLUTICASONE PROPIONATE 50 MCG/ACT NA SUSP: 1.0000 | Freq: Two times a day (BID) | NASAL | Status: DC

## 2012-07-08 MED ORDER — CLARITHROMYCIN 500 MG PO TABS: 500.0000 mg | ORAL_TABLET | Freq: Two times a day (BID) | ORAL | Status: DC

## 2012-07-08 MED ORDER — AMOXICILLIN 500 MG PO TABS: 1000.0000 mg | ORAL_TABLET | Freq: Two times a day (BID) | ORAL | Status: DC

## 2012-07-08 MED ORDER — PANTOPRAZOLE SODIUM 40 MG PO TBEC: 40.0000 mg | DELAYED_RELEASE_TABLET | Freq: Two times a day (BID) | ORAL | Status: DC

## 2012-07-08 NOTE — Assessment & Plan Note (Addendum)
Likely peptic ulcer disease. No evidence of gallbladder etiology. poct h pylori checked and positive. Will treat with clarithromycin 500mg  bid, amoxicillin 1000mg  bid and protonix 40 bid for 14 days. Upon review of chart, I do not see any prior treatment for H-pylori. Will therefore treat with clarithromycin.  Result was obtained after patient left. Lett message on voicemail. Avoid NSAIDs.

## 2012-07-08 NOTE — Patient Instructions (Addendum)
I will call you with the results of the H-pylori test which checks for a bacteria in the stomach.   Also, I will call you if the results for the thyroid are abnormal.   Please follow up with Dr. Armen Pickup when you return from [place].

## 2012-07-08 NOTE — Telephone Encounter (Signed)
Called patient back on the phone number she listed as working. Let her know of the positive H pylori results and that the three medications were sent to the [facility] pharmacy.  Patient expressed understanding and agreed to care.  Marena Chancy, PGY-3 Family Medicine Resident

## 2012-07-08 NOTE — Telephone Encounter (Signed)
Dr. Gwenlyn Saran,   Did you call her? Shakala Marlatt, Maryjo [place]

## 2012-07-08 NOTE — Telephone Encounter (Signed)
Pt is returning call from [facility] if calling today use the number shown if calling tomorrow use number on file. JW

## 2012-07-08 NOTE — Assessment & Plan Note (Signed)
Refilled flonase  

## 2012-07-08 NOTE — Progress Notes (Signed)
Patient ID: Felipe Drone    DOB: 1975-08-06, 37 y.o.   MRN: [id number] --- Subjective:  Yesennia is a 37 y.o.female with h/o gonadal dysgenesis, cerebellar tumor, who presents with epigastric abdominal pain.  Pain started on Saturday, 2 days ago. Occurred in the morning after she had eaten something spicy the night before. Pain was burning and sharp, located in the epigastric area. Associated with 2 episodes of non bloody, non bilious emesis and some nausea. Pain was constant for 2-3 hours. She took a mint based medicine from her country and this helped. Now pain is improved without nausea or vomiting.  She denies any melenous stools, any diarrhea, any constipation, any dysuria or polyuria.  She states that she has had this happen before and was given a medicine for acid which helped.   - also requesting refill for flonase: ran out. Congestion and allegies are back since discontinuation of medicine which helps. No fevers, no chills.    ROS: see HPI Past Medical History: reviewed and updated medications and allergies. Social History: Tobacco: none   Objective: Filed Vitals:   07/08/12 1000  BP: 126/78  Pulse: 77  Temp: 97.9 F (36.6 C)    Physical Examination:   General appearance - alert, well appearing, and in no distress Nose - erythematous and congested nasal turbinates bilaterally Mouth - mucous membranes moist, pharynx normal without lesions Neck - supple, no significant adenopathy Abdomen - soft,  nondistended, no masses or organomegaly, tender in epigastrium, negative Murphy's.

## 2012-07-08 NOTE — Assessment & Plan Note (Signed)
Saw elevated TSH in recent labs. Will obtain follow up free T4/T3 for further workup, since patient is in clinic before leaving for [place] for 1 month

## 2012-07-10 ENCOUNTER — Telehealth: Payer: Self-pay | Admitting: Family Medicine

## 2012-07-10 DIAGNOSIS — A048 Other specified bacterial intestinal infections: Secondary | ICD-10-CM

## 2012-07-10 MED ORDER — AMOXICILLIN 500 MG PO TABS: 1000.0000 mg | ORAL_TABLET | Freq: Two times a day (BID) | ORAL | Status: DC

## 2012-07-10 MED ORDER — PANTOPRAZOLE SODIUM 40 MG PO TBEC: 40.0000 mg | DELAYED_RELEASE_TABLET | Freq: Two times a day (BID) | ORAL | Status: DC

## 2012-07-10 MED ORDER — CLARITHROMYCIN 500 MG PO TABS: 500.0000 mg | ORAL_TABLET | Freq: Two times a day (BID) | ORAL | Status: DC

## 2012-07-10 NOTE — Addendum Note (Signed)
Addended by: Marena Chancy E on: 07/10/2012 03:00 PM   Modules accepted: Orders

## 2012-07-10 NOTE — Telephone Encounter (Signed)
She can come by the clinic and pick up hard copy prescriptions and bring them to the [facility] and see if they will be cheaper.   Marena Chancy, PGY-3 Family Medicine Resident

## 2012-07-10 NOTE — Telephone Encounter (Signed)
Pt is calling for a cheaper version's of these medications : amoxicillin 500 mg, clarithromycin 50 mg, and pantoprazole 40 mg. She was prescribed these on 7/7 . She was able to pick up the Flonase. She could not afford the another three and was told by the pharmacist that she not by just one since all three need to be taken together. She is not sure what she do. JW

## 2012-07-16 ENCOUNTER — Encounter: Payer: Self-pay | Admitting: Family Medicine

## 2012-07-17 NOTE — Telephone Encounter (Signed)
error 

## 2012-08-27 ENCOUNTER — Encounter: Payer: Self-pay | Admitting: Family Medicine

## 2012-08-27 ENCOUNTER — Ambulatory Visit (INDEPENDENT_AMBULATORY_CARE_PROVIDER_SITE_OTHER): Payer: No Typology Code available for payment source | Admitting: Family Medicine

## 2012-08-27 VITALS — BP 110/71 | HR 93 | Temp 98.3°F | Ht 66.5 in | Wt 179.0 lb

## 2012-08-27 DIAGNOSIS — E039 Hypothyroidism, unspecified: Secondary | ICD-10-CM

## 2012-08-27 DIAGNOSIS — J309 Allergic rhinitis, unspecified: Secondary | ICD-10-CM

## 2012-08-27 DIAGNOSIS — R1013 Epigastric pain: Secondary | ICD-10-CM

## 2012-08-27 MED ORDER — FEXOFENADINE HCL 30 MG PO TABS: 60.0000 mg | ORAL_TABLET | Freq: Two times a day (BID) | ORAL | Status: DC

## 2012-08-27 NOTE — Assessment & Plan Note (Signed)
A: assoc with food intake P: refill pantoprazole

## 2012-08-27 NOTE — Assessment & Plan Note (Signed)
A: worsening, currently taking flonase and med from [place]; recent CT head (06/14) unremarkable P: cont flonase, add allegra

## 2012-08-27 NOTE — Assessment & Plan Note (Signed)
A: nml free t3/4, possibly subclinical, asymptomatic at this time, other than mild weakness P: will f/up in 6 months unless becomes symptomatic

## 2012-08-27 NOTE — Progress Notes (Signed)
Patient ID: Sandra Hahn, female   DOB: Oct 28, 1975, 37 y.o.   MRN: [id number] [facility] Charlane Ferretti, MD Phone: [phone]  Subjective:  Sandra Hahn is a 37 y.o.female with h/o gonadal dysgenesis, cerebellar tumor s/p resection who presents for f/up of thyroid testing and hpylori  # f/up thyroid lab studies  - elevated TSH-5.35 (06/14), f/up Ft4/t3 1.05/2.9 (07/14) -asymptomatic apart from feeling weak on a regular basis, will sleep 8hrs per night, no skin changes, no lethargy  #h.pylori - finished medication regimen in July, ocassional mild epigastric pain, associated with eating later at night and then laying down, no other complications #allergic rhinitis - chronic, started in 1999, has been taking flonase and zyrtec, attests to worsening symptoms itchy throat, SOB, and some periorbital edema, aggravating factors include strong perfumes; no pets in home, no rodents  -now taking a med from [place], unclear whether this is an antihistamine   ROS--See HPI  Past Medical History Patient Active Problem List   Diagnosis Date Noted  . Abdominal pain, epigastric 07/08/2012  . Abnormal TSH 07/08/2012  . Unspecified hypothyroidism 06/21/2012  . TIA (transient ischemic attack) 06/19/2012  . Sinusitis 01/10/2012  . Gonadal dysgenesis 08/17/2011  . Female infertility associated with anovulation 08/08/2011  . Verruca vulgaris 08/08/2011  . Allergic rhinitis 08/08/2011  . Well woman exam with routine gynecological exam 08/08/2011  . Lumbar disc herniation 07/05/2011  . Chronic tension headaches 07/05/2011  . Cerebellar tumor 02/14/2011   Reviewed problem list.  Medications- reviewed and updated Chief complaint-noted Social History-  Objective: BP 110/71  Pulse 93  Temp(Src) 98.3 F (36.8 C) (Oral)  Ht 5' 6.5" (1.689 m)  Wt 179 lb (81.194 kg)  BMI 28.46 kg/m2  LMP 08/26/2012 Gen: NAD, alert, cooperative with exam HEENT: NCAT, EOMI, PERRL, TMs nml,  inflammed nasal turbinates bilat Neck: FROM, supple, mild tenderness perioccipitally Abd: SNTND, BS present, no guarding or organomegaly Skin: no rashes no lesions  Assessment/Plan:  See problem based

## 2012-08-27 NOTE — Patient Instructions (Signed)
Sheina it was great to meet you today! I am sorry you are still having trouble with your allergies.  I would like you to try allegra which I have prescribed for you to see if that helps at all. Also work on identifying triggers at home that make your symptoms worse Your thyroid levels look okay at this time. But I would like to keep a close eye on them. We will repeat testing in 6 months. I will represcribe the medication for your reflux and hopefully this should help your heartburn symptoms but let me know if they still do not go away. If you develop vomiting, see blood in your saliva or have difficulty swallowing call our clinic or 911 immediately.   Charlane Ferretti, MD

## 2012-09-04 ENCOUNTER — Telehealth: Payer: Self-pay | Admitting: Family Medicine

## 2012-09-04 NOTE — Telephone Encounter (Signed)
Pt needs a letter stating she has chronic headaches and unable to work. She is taking tylenol permanently Letter to be mailed pt. She is applying for disabilty Please advise

## 2012-09-04 NOTE — Telephone Encounter (Signed)
Will forward to MD. Rastus Borton,CMA  

## 2012-11-13 ENCOUNTER — Ambulatory Visit (INDEPENDENT_AMBULATORY_CARE_PROVIDER_SITE_OTHER): Payer: No Typology Code available for payment source | Admitting: Family Medicine

## 2012-11-13 ENCOUNTER — Encounter: Payer: Self-pay | Admitting: Family Medicine

## 2012-11-13 VITALS — BP 116/79 | HR 95 | Temp 97.4°F | Ht 66.0 in | Wt 187.3 lb

## 2012-11-13 DIAGNOSIS — J0110 Acute frontal sinusitis, unspecified: Secondary | ICD-10-CM

## 2012-11-13 DIAGNOSIS — J329 Chronic sinusitis, unspecified: Secondary | ICD-10-CM

## 2012-11-13 DIAGNOSIS — J0100 Acute maxillary sinusitis, unspecified: Secondary | ICD-10-CM

## 2012-11-13 MED ORDER — DOXYCYCLINE HYCLATE 100 MG PO TABS: 100.0000 mg | ORAL_TABLET | Freq: Two times a day (BID) | ORAL | Status: DC

## 2012-11-13 NOTE — Patient Instructions (Signed)
I think you have a viral sinusitis but there are symptoms (severe sinus tenderness and green drainage) that make me concerned about a bacterial infection. If your symptoms are not better by day 10 or if they worsen before then, I want you to take the doxycycline antibiotic. If you don't get better on this medicine after 7 days, please come back and see [place].   Thanks, Dr. Durene Cal  When you are feeling better come see your regular doctor about the following: Health Maintenance Due  Topic Date Due  . Tetanus/tdap  01/02/1994  . Influenza Vaccine  08/02/2012

## 2012-11-13 NOTE — Progress Notes (Signed)
  Subjective:     Sandra Hahn is a 37 y.o. female who presents for evaluation of sinus pain. Symptoms include: congestion, cough, facial pain, frequent clearing of the throat, headaches and purulent rhinorrhea. Onset of symptoms was 4 days ago. Symptoms have been gradually improving since yesterday but worsened up until that time (and only slightly better). Past history is significant for sinusitis in january that responded to doxycycline. Patient is a non-smoker.Treatments-honey prn, olive oil on chest.   Past Medical History-history of TIA (asked patient to restart aspirin which she was not taking), hypothyroidism history, infertility/anovulation  Review of Systems Subjective fevers but no temperature above 100, occasional chills, cough with occasional mucus, thick green mucus from nostrils.    Objective:    BP 116/79  Pulse 95  Temp(Src) 97.4 F (36.3 C) (Oral)  Ht 5\' 6"  (1.676 m)  Wt 187 lb 4.8 oz (84.959 kg)  BMI 30.25 kg/m2  LMP 10/13/2012 General appearance: alert, cooperative and appears mildly ill (slumped over, uncomfortable) Eyes: conjunctivae/corneas clear. PERRL, EOM's intact. Ears: normal TM's and external ear canals both ears Nose: green discharge. Patient very tender to sinus palpation in both frontal and maxillary sinuses Throat: oropharynx normal Neck: no adenopathy Lungs: clear to auscultation bilaterally Heart: regular rate and rhythm, S1, S2 normal, no murmur, click, rub or gallop Extremities: extremities normal, atraumatic, no cyanosis or edema Skin: Skin color, texture, turgor normal. No rashes or lesions Neurologic: Grossly normal    Assessment:    Acute bacterial vs. viral sinusitis.  Patient with 4 days of severe sinus pain, fatigue, and purulent drainage. Symptoms slightly better today than yesterday. Patient technically does not meet criteria for bacterial given afebrile but she appears mildly ill. I have discussed with her that I think she would  benefit from antibiotics if her symptoms worsen or last beyond 10 days.    Plan:    Nasal saline sprays. Doxycycline per medication orders to be used only if symptoms worsen, she is febrile, or symptoms last beyond 10 days Follow up in 7 days (after antibiotics if used) or as needed.

## 2012-11-13 NOTE — Assessment & Plan Note (Signed)
Some symptoms to suggest bacterial (severe sinus pain and purulent drainage) but afebrile. Gave patient doxycycline to be used if symptoms worse, she is febrile, or no improvement by 10 days.

## 2013-01-15 ENCOUNTER — Ambulatory Visit: Payer: Self-pay

## 2013-05-06 ENCOUNTER — Ambulatory Visit: Payer: No Typology Code available for payment source

## 2013-05-07 ENCOUNTER — Encounter: Payer: Self-pay | Admitting: Family Medicine

## 2013-05-07 ENCOUNTER — Telehealth: Payer: Self-pay | Admitting: *Deleted

## 2013-05-07 ENCOUNTER — Ambulatory Visit (INDEPENDENT_AMBULATORY_CARE_PROVIDER_SITE_OTHER): Payer: No Typology Code available for payment source | Admitting: Family Medicine

## 2013-05-07 ENCOUNTER — Ambulatory Visit (HOSPITAL_COMMUNITY)
Admission: RE | Admit: 2013-05-07 | Discharge: 2013-05-07 | Disposition: A | Discharge status: Home / Self Care | Payer: No Typology Code available for payment source | Source: Ambulatory Visit | Attending: Family Medicine | Admitting: Family Medicine

## 2013-05-07 VITALS — BP 112/76 | HR 87 | Temp 98.4°F | Wt 181.0 lb

## 2013-05-07 DIAGNOSIS — M25552 Pain in left hip: Secondary | ICD-10-CM

## 2013-05-07 DIAGNOSIS — M25559 Pain in unspecified hip: Secondary | ICD-10-CM | POA: Insufficient documentation

## 2013-05-07 NOTE — Telephone Encounter (Signed)
Message copied by Valerie Roys on Wed May 07, 2013  4:59 PM ------      Message from: [place], [place]: Wed May 07, 2013  2:38 PM       Please let patient know her X-ray was negative. Continue with Aleve, heat and exercises. If not improved we will send her to Sports medicine to be evaluated. Thanks, [employer] [place], M.D.       ------

## 2013-05-07 NOTE — Assessment & Plan Note (Signed)
A: Left hip pain. DDx includes arthritis, IT band, radicular pain from back, muscle strain or bursitis. No red flags on history or exam  P: - Xray today to evaluate hip - Aleve BID for inflammation - Heat to area for comfort - Given ROM exercises to help strengthen the hip - If not improved in 1 week, will let us know for possible evaluation at [facility]

## 2013-05-07 NOTE — Telephone Encounter (Signed)
LM for patient to call back.  Please inform patient of this message when she calls back.  Thanks [employer]

## 2013-05-07 NOTE — Patient Instructions (Signed)
We will get the X-ray of your hip today. Take 2 Aleve twice daily Use a heating pad on the area.  Once we know the bones in your hip are ok, I will let you know. Start doing the following exercises.  Amber M. Hairford, M.D.  Hip Exercises RANGE OF MOTION (ROM) AND STRETCHING EXERCISES  These exercises may help you when beginning to rehabilitate your injury. Doing them too aggressively can worsen your condition. Complete them slowly and gently. Your symptoms may resolve with or without further involvement from your physician, physical therapist or athletic trainer. While completing these exercises, remember:   Restoring tissue flexibility helps normal motion to return to the joints. This allows healthier, less painful movement and activity.  An effective stretch should be held for at least 30 seconds.  A stretch should never be painful. You should only feel a gentle lengthening or release in the stretched tissue. If these stretches worsen your symptoms even when done gently, consult your physician, physical therapist or athletic trainer. STRETCH Hamstrings, Supine   Lie on your back. Loop a belt or towel over the ball of your right / left foot.  Straighten your right / left knee and slowly pull on the belt to raise your leg. Do not allow the right / left knee to bend. Keep your opposite leg flat on the floor.  Raise the leg until you feel a gentle stretch behind your right / left knee or thigh. Hold this position for __________ seconds. Repeat __________ times. Complete this stretch __________ times per day.  STRETCH - Hip Rotators   Lie on your back on a firm surface. Grasp your right / left knee with your right / left hand and your ankle with your opposite hand.  Keeping your hips and shoulders firmly planted, gently pull your right / left knee and rotate your lower leg toward your opposite shoulder until you feel a stretch in your buttocks.  Hold this stretch for __________  seconds. Repeat this stretch __________ times. Complete this stretch __________ times per day. STRETCH - Hamstrings/Adductors, V-Sit   Sit on the floor with your legs extended in a large "V," keeping your knees straight.  With your head and chest upright, bend at your waist reaching for your right foot to stretch your left adductors.  You should feel a stretch in your left inner thigh. Hold for __________ seconds.  Return to the upright position to relax your leg muscles.  Continuing to keep your chest upright, bend straight forward at your waist to stretch your hamstrings.  You should feel a stretch behind both of your thighs and/or knees. Hold for __________ seconds.  Return to the upright position to relax your leg muscles.  Repeat steps 2 through 4 for opposite leg. Repeat __________ times. Complete this exercise __________ times per day.  STRETCHING - Hip Flexors, Lunge  Half kneel with your right / left knee on the floor and your opposite knee bent and directly over your ankle.  Keep good posture with your head over your shoulders. Tighten your buttocks to point your tailbone downward; this will prevent your back from arching too much.  You should feel a gentle stretch in the front of your thigh and/or hip. If you do not feel any resistance, slightly slide your opposite foot forward and then slowly lunge forward so your knee once again lines up over your ankle. Be sure your tailbone remains pointed downward.  Hold this stretch for __________ seconds. Repeat __________  times. Complete this stretch __________ times per day. STRENGTHENING EXERCISES These exercises may help you when beginning to rehabilitate your injury. They may resolve your symptoms with or without further involvement from your physician, physical therapist or athletic trainer. While completing these exercises, remember:   Muscles can gain both the endurance and the strength needed for everyday activities through  controlled exercises.  Complete these exercises as instructed by your physician, physical therapist or athletic trainer. Progress the resistance and repetitions only as guided.  You may experience muscle soreness or fatigue, but the pain or discomfort you are trying to eliminate should never worsen during these exercises. If this pain does worsen, stop and make certain you are following the directions exactly. If the pain is still present after adjustments, discontinue the exercise until you can discuss the trouble with your clinician. STRENGTH - Hip Extensors, Bridge   Lie on your back on a firm surface. Bend your knees and place your feet flat on the floor.  Tighten your buttocks muscles and lift your bottom off the floor until your trunk is level with your thighs. You should feel the muscles in your buttocks and back of your thighs working. If you do not feel these muscles, slide your feet 1-2 inches further away from your buttocks.  Hold this position for __________ seconds.  Slowly lower your hips to the starting position and allow your buttock muscles relax completely before beginning the next repetition.  If this exercise is too easy, you may cross your arms over your chest. Repeat __________ times. Complete this exercise __________ times per day.  STRENGTH - Hip Abductors, Straight Leg Raises  Be aware of your form throughout the entire exercise so that you exercise the correct muscles. Sloppy form means that you are not strengthening the correct muscles.  Lie on your side so that your head, shoulders, knee and hip line up. You may bend your lower knee to help maintain your balance. Your right / left leg should be on top.  Roll your hips slightly forward, so that your hips are stacked directly over each other and your right / left knee is facing forward.  Lift your top leg up 4-6 inches, leading with your heel. Be sure that your foot does not drift forward or that your knee does not  roll toward the ceiling.  Hold this position for __________ seconds. You should feel the muscles in your outer hip lifting (you may not notice this until your leg begins to tire).  Slowly lower your leg to the starting position. Allow the muscles to fully relax before beginning the next repetition. Repeat __________ times. Complete this exercise __________ times per day.  STRENGTH - Hip Adductors, Straight Leg Raises   Lie on your side so that your head, shoulders, knee and hip line up. You may place your upper foot in front to help maintain your balance. Your right / left leg should be on the bottom.  Roll your hips slightly forward, so that your hips are stacked directly over each other and your right / left knee is facing forward.  Tense the muscles in your inner thigh and lift your bottom leg 4-6 inches. Hold this position for __________ seconds.  Slowly lower your leg to the starting position. Allow the muscles to fully relax before beginning the next repetition. Repeat __________ times. Complete this exercise __________ times per day.  STRENGTH - Quadriceps, Straight Leg Raises  Quality counts! Watch for signs that the quadriceps muscle  is working to insure you are strengthening the correct muscles and not "cheating" by substituting with healthier muscles.  Lay on your back with your right / left leg extended and your opposite knee bent.  Tense the muscles in the front of your right / left thigh. You should see either your knee cap slide up or increased dimpling just above the knee. Your thigh may even quiver.  Tighten these muscles even more and raise your leg 4 to 6 inches off the floor. Hold for right / left seconds.  Keeping these muscles tense, lower your leg.  Relax the muscles slowly and completely in between each repetition. Repeat __________ times. Complete this exercise __________ times per day.  STRENGTH - Hip Abductors, Standing  Tie one end of a rubber exercise  band/tubing to a secure surface (table, pole) and tie a loop at the other end.  Place the loop around your right / left ankle. Keeping your ankle with the band directly opposite of the secured end, step away until there is tension in the tube/band.  Hold onto a chair as needed for balance.  Keeping your back upright, your shoulders over your hips, and your toes pointing forward, lift your right / left leg out to your side. Be sure to lift your leg with your hip muscles. Do not "throw" your leg or tip your body to lift your leg.  Slowly and with control, return to the starting position. Repeat exercise __________ times. Complete this exercise __________ times per day.  STRENGTH  Quadriceps, Squats  Stand in a door frame so that your feet and knees are in line with the frame.  Use your hands for balance, not support, on the frame.  Slowly lower your weight, bending at the hips and knees. Keep your lower legs upright so that they are parallel with the door frame. Squat only within the range that does not increase your knee pain. Never let your hips drop below your knees.  Slowly return upright, pushing with your legs, not pulling with your hands. Document Released: 01/06/2005 Document Revised: 03/13/2011 Document Reviewed: 04/02/2008 [facility] Patient Information 2014 [place], [place].

## 2013-05-07 NOTE — Progress Notes (Signed)
Patient ID: Arnoldo Lenis, female   DOB: Nov 07, 1975, 38 y.o.   MRN: [id number]    Subjective: HPI: Patient is a 38 y.o. female presenting to clinic today for left hip/leg pain.  Hip Pain Patient complains of left hip pain. Onset of the symptoms was several months ago. Inciting event: none. The patient reports the hip pain is worse after period of inactivity. Associated symptoms: none, reports some swelling in the posterior hip. Aggravating symptoms include: going up and down stairs and inactivity. Patient has had no prior hip problems. Previous visits for this problem: none. Evaluation to date: none. Treatment to date: Tylenol, warm water and olive oil. Has history of lumbar disc herniation 3 years ago. Went to PT. Did not have this type of pain in the past. No fevers, numbness, or tingling. This does disturb her gait.  History Reviewed: Never smoker.  ROS: Please see HPI above.  Objective: Office vital signs reviewed. BP 112/76  Pulse 87  Temp(Src) 98.4 F (36.9 C) (Oral)  Wt 181 lb (82.101 kg)  LMP 04/23/2013  Physical Examination:  General: Awake, alert. NAD HEENT: Atraumatic, normocephalic.MMM Extremities:  No TTP over left hip. No appreciable edema or swelling in area where patient describes. Good flexion and extension of left hip. Full IR and ER although both are uncomfortable. Neuro: Grossly intact, slightly limp with ambulation  Assessment: 38 y.o. female with left hip pain  Plan: See Problem List and After Visit Summary

## 2013-05-13 IMAGING — CT CT HEAD W/O CM
1 series · 16 of 30 positions shown, 20 images · non-contrast
Comparison: None.

CLINICAL DATA: Back pain, head pressure, history of brain cancer 7
years ago

CT HEAD WITHOUT CONTRAST
TECHNIQUE: Contiguous axial images were obtained from the base of
the skull through the vertex without contrast.

[Series 2: head routine 4.8 h37s · axial · 0.43mm/px · z∈[-139,-8]mm · 16 of 30 slices shown, 20 images]
[im 2/30  brain]
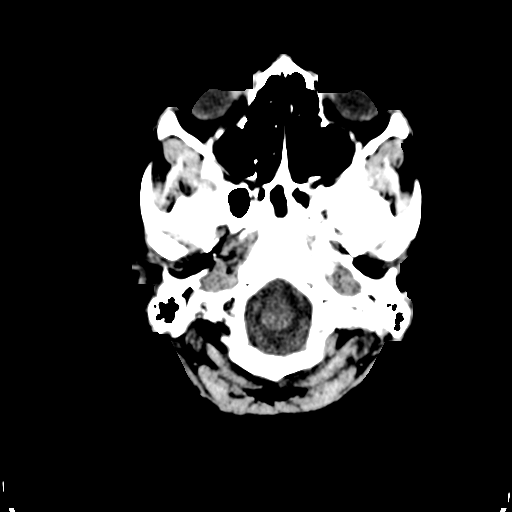
[im 2/30  bone]
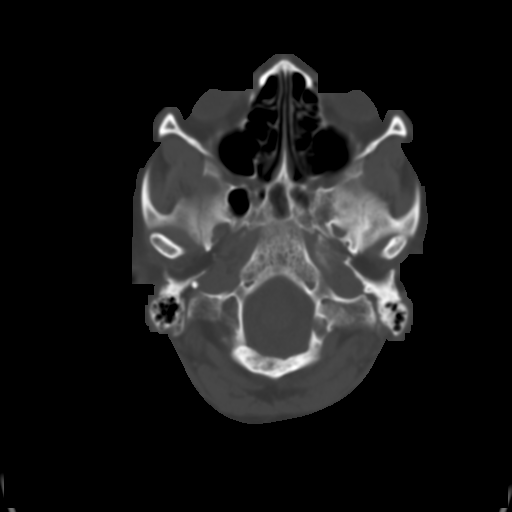
[im 4/30  brain]
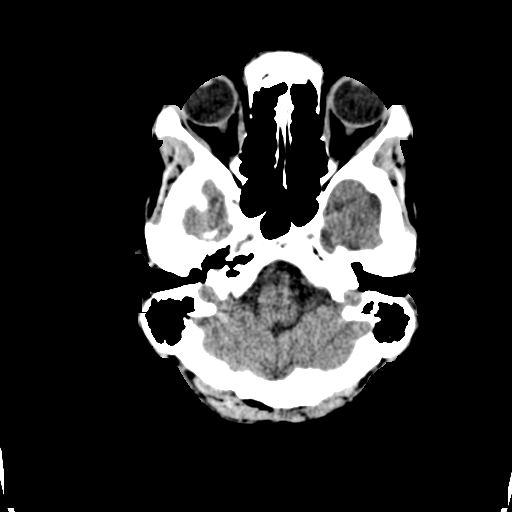
[im 6/30  brain]
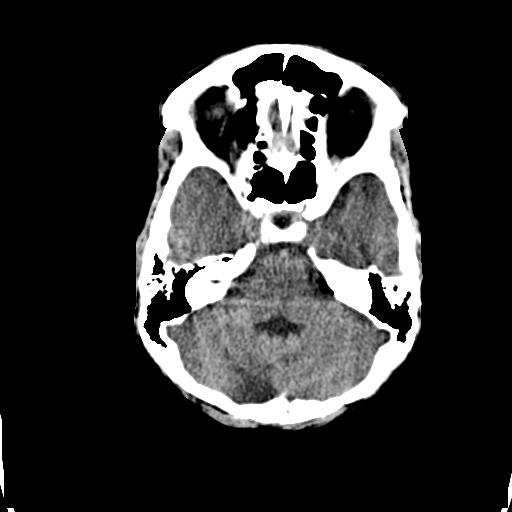
[im 8/30  brain]
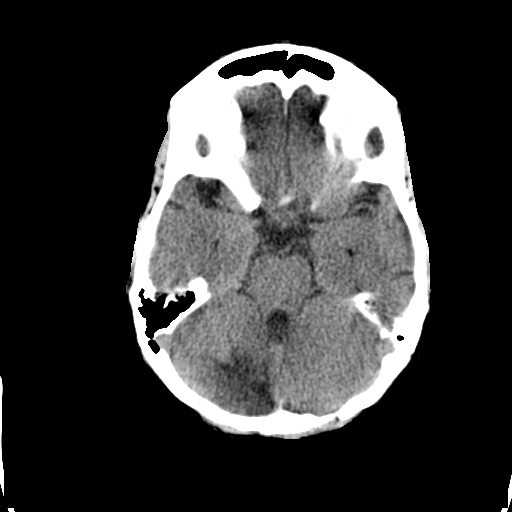
[im 9/30  brain]
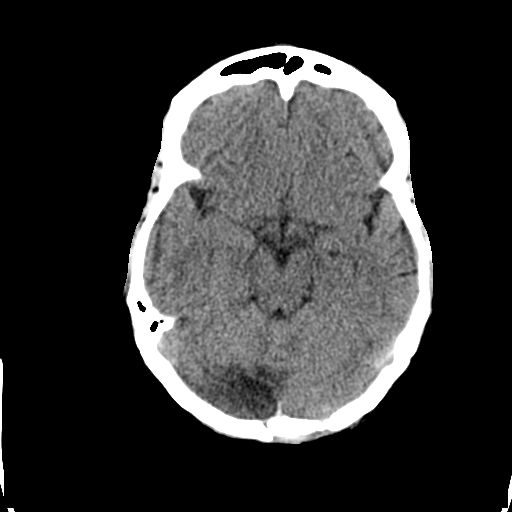
[im 9/30  bone]
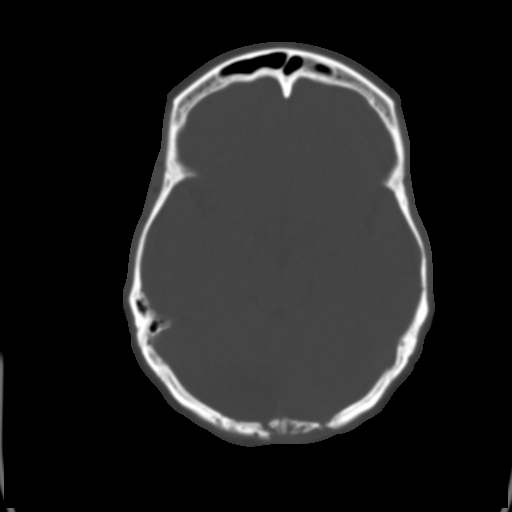
[im 11/30  brain]
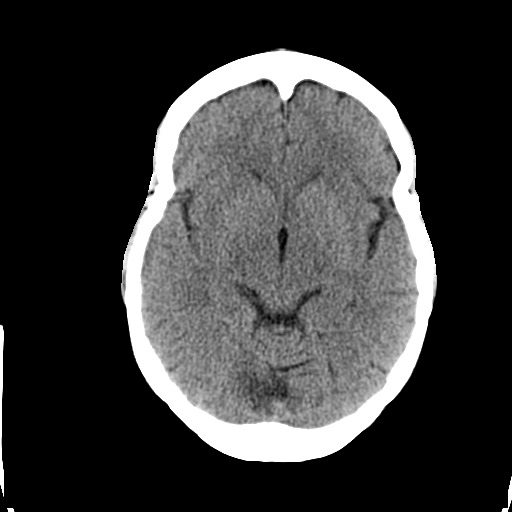
[im 13/30  brain]
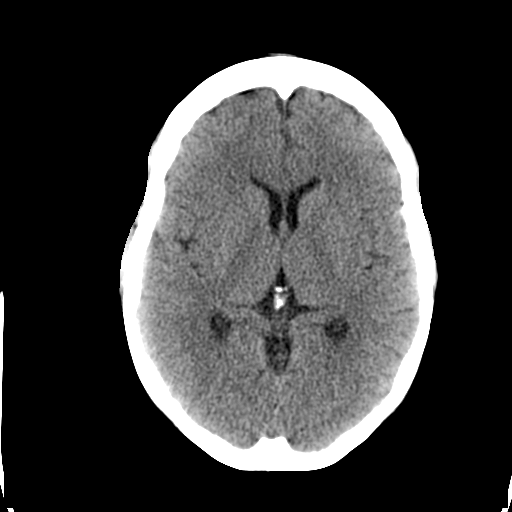
[im 15/30  brain]
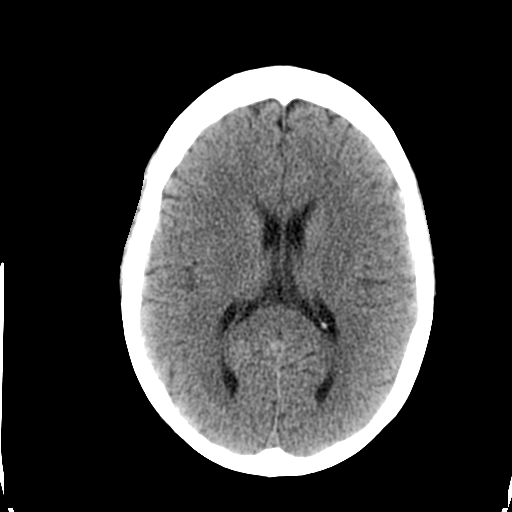
[im 16/30  brain]
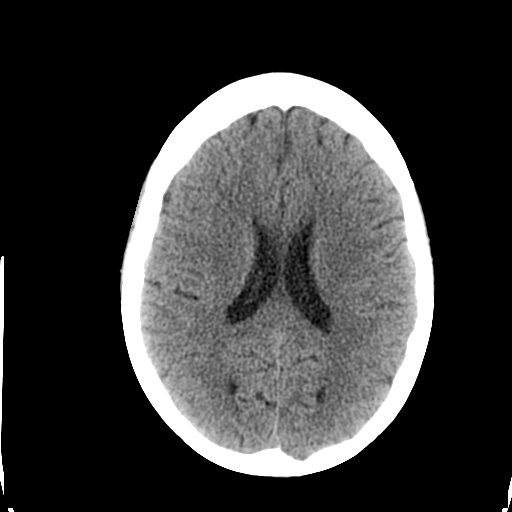
[im 16/30  bone]
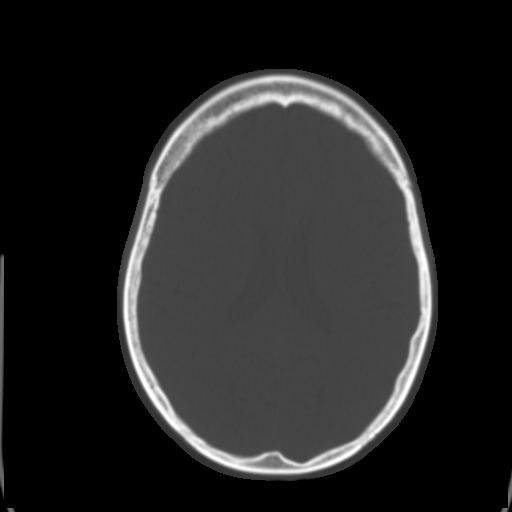
[im 18/30  brain]
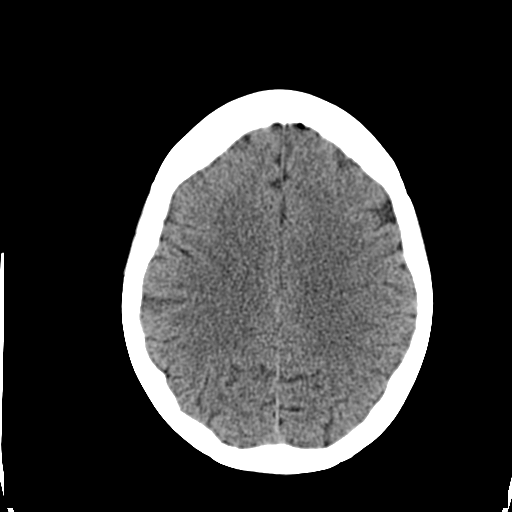
[im 20/30  brain]
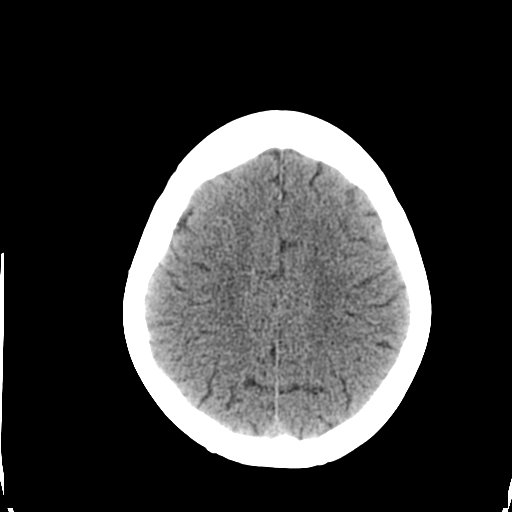
[im 22/30  brain]
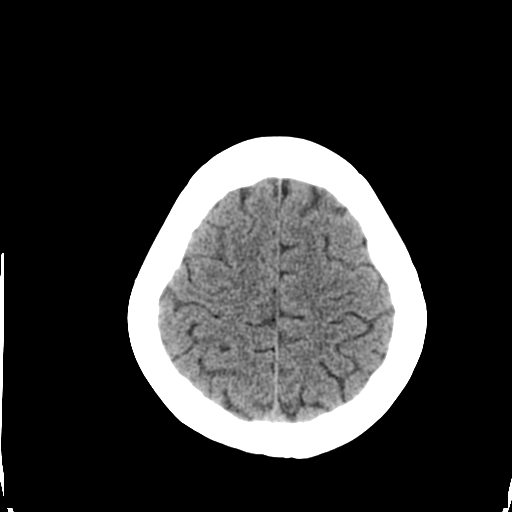
[im 23/30  brain]
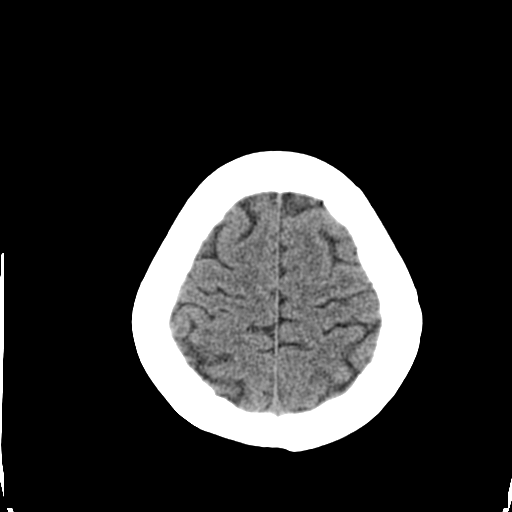
[im 23/30  bone]
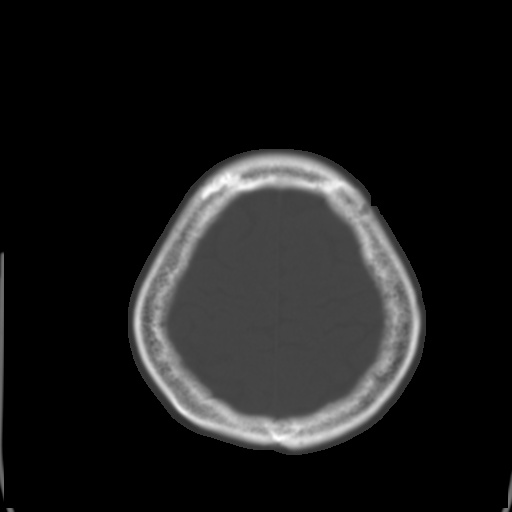
[im 25/30  brain]
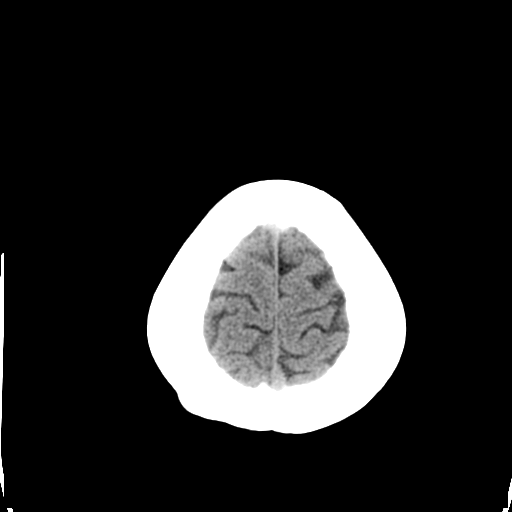
[im 27/30  brain]
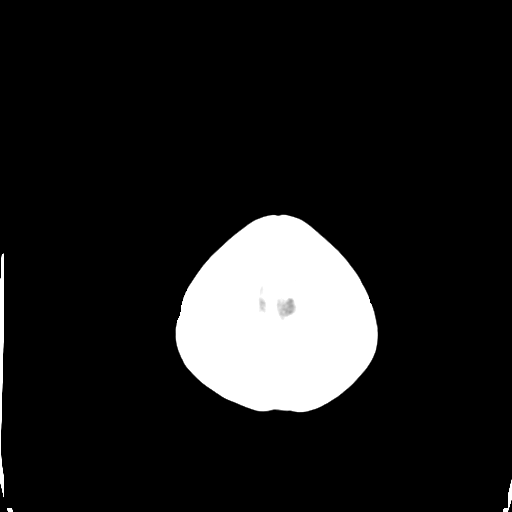
[im 29/30  brain]
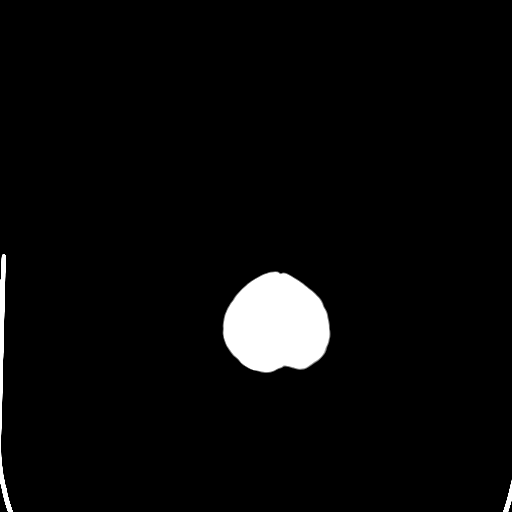

[16 of 30 positions shown; findings below may reference images not displayed]

FINDINGS: There is encephalomalacia within the medial right
cerebellar hemisphere. The gray/white differentiation is otherwise
normal. There is no evidence of acute hemorrhage or abnormal extra-
axial fluid collections.   There is no mass effect or midline
shift.  The ventricles are normal in size, shape, and position.

There is a small craniotomy defect at the left occipital bone.
The orbits and visualized paranasal sinuses are unremarkable.
IMPRESSION: There is no evidence of acute intracranial abnormality.

There is encephalomalacia in the medial right cerebellar hemisphere
with overlying craniotomy defect.

## 2013-05-19 ENCOUNTER — Ambulatory Visit: Payer: Self-pay

## 2013-05-20 ENCOUNTER — Encounter: Payer: Self-pay | Admitting: Family Medicine

## 2013-05-20 ENCOUNTER — Ambulatory Visit (INDEPENDENT_AMBULATORY_CARE_PROVIDER_SITE_OTHER): Payer: No Typology Code available for payment source | Admitting: Family Medicine

## 2013-05-20 VITALS — BP 120/82 | HR 78 | Temp 98.3°F | Ht 66.5 in | Wt 183.3 lb

## 2013-05-20 DIAGNOSIS — J309 Allergic rhinitis, unspecified: Secondary | ICD-10-CM

## 2013-05-20 DIAGNOSIS — Z9109 Other allergy status, other than to drugs and biological substances: Secondary | ICD-10-CM

## 2013-05-20 DIAGNOSIS — H6120 Impacted cerumen, unspecified ear: Secondary | ICD-10-CM

## 2013-05-20 MED ORDER — CARBAMIDE PEROXIDE 6.5 % OT SOLN: 5.0000 [drp] | Freq: Two times a day (BID) | OTIC | Status: DC

## 2013-05-20 MED ORDER — LORATADINE 10 MG PO TABS: 10.0000 mg | ORAL_TABLET | Freq: Every day | ORAL | Status: DC

## 2013-05-20 NOTE — Patient Instructions (Addendum)
Sandra Hahn it was great to see you today!  I am pleased to hear that things are going well for you. I am sorry you are having difficulty with the ear waxing and hearing. Please take the debrox drops twice daily for the next several days. You can also use rubbing alcohol one cap in your ear every day for 30 seconds on each side Please do not use anything in your ear to scrape it out  I have also prescribed you claritin for your allergies. Please take daily as this may help with sinus pressure and ear discomfort  If the hearing does not get better after ear drops please call and let us know. I can always make a referral to ENT  Looking forward to seeing you soon Bernadene Bell, MD

## 2013-05-20 NOTE — Assessment & Plan Note (Signed)
A: improved s/p ear drops in clinic; however still cannot visualize TM properly; hearing loss assc with impaction (no formal testing today) P: rx for debrox for next 7 days Pt to intermittently use H2O2 capful  Given red flags and reasons to rtc Pt instructed to be patient and allow time for medicines to work

## 2013-05-20 NOTE — Assessment & Plan Note (Signed)
A: cannot afford flonase; moderate benefit from allegra P: will switch to claritin for improvement Hope this also helps her hearing issues

## 2013-05-20 NOTE — Progress Notes (Signed)
Patient ID: Arnoldo Lenis, female   DOB: 1975/12/20, 38 y.o.   MRN: [id number] [facility] Bernadene Bell, MD Phone: [phone]  Subjective:  Ms Guimont is a 38y.o F here for SDA for an acute issue  # ear pain, hearing loss on L side -for the past several weeks has been getting worse -no drainage, has tried to use olive oil bilaterally as well as currette with minimal relief -has had flare of allergies lately but cannot afford flonase -no fevers, chills, headaches, cold cough or congestion  All systems were reviewed and were negative unless otherwise noted in the HPI  Past Medical History Patient Active Problem List   Diagnosis Date Noted  . Hip pain, left 05/07/2013  . Abdominal pain, epigastric 07/08/2012  . Abnormal TSH 07/08/2012  . Unspecified hypothyroidism 06/21/2012  . TIA (transient ischemic attack) 06/19/2012  . Sinusitis 01/10/2012  . Gonadal dysgenesis 08/17/2011  . Female infertility associated with anovulation 08/08/2011  . Verruca vulgaris 08/08/2011  . Allergic rhinitis 08/08/2011  . Well woman exam with routine gynecological exam 08/08/2011  . Lumbar disc herniation 07/05/2011  . Chronic tension headaches 07/05/2011  . Cerebellar tumor 02/14/2011   Reviewed problem list.  Medications- reviewed and updated Chief complaint-noted No additions to family history Social history- patient is a never smoker  Objective: BP 120/82  Pulse 78  Temp(Src) 98.3 F (36.8 C) (Oral)  Ht 5' 6.5" (1.689 m)  Wt 183 lb 4.8 oz (83.144 kg)  BMI 29.15 kg/m2  LMP 04/23/2013 Gen: NAD, alert, cooperative with exam HEENT: NCAT, EOMI, PERRL, left canal severely impacted, cannot visualize ear drum, right TM with good light reflex  Neck: FROM, supple Neuro: Alert and oriented, No gross deficits. No sinus pressure or pain Skin: no rashes no lesions  Given ear drops in house with some disimpaction  Assessment/Plan: See problem based a/p

## 2013-05-27 ENCOUNTER — Encounter: Payer: Self-pay | Admitting: Family Medicine

## 2013-05-27 ENCOUNTER — Ambulatory Visit: Payer: Self-pay

## 2013-05-27 ENCOUNTER — Encounter (INDEPENDENT_AMBULATORY_CARE_PROVIDER_SITE_OTHER): Payer: No Typology Code available for payment source | Admitting: Family Medicine

## 2013-05-27 ENCOUNTER — Ambulatory Visit (INDEPENDENT_AMBULATORY_CARE_PROVIDER_SITE_OTHER): Payer: No Typology Code available for payment source | Admitting: Family Medicine

## 2013-05-27 DIAGNOSIS — H6690 Otitis media, unspecified, unspecified ear: Secondary | ICD-10-CM

## 2013-05-27 MED ORDER — NEOMYCIN-POLYMYXIN-HC 3.5-10000-1 OT SOLN: 3.0000 [drp] | Freq: Four times a day (QID) | OTIC | Status: DC

## 2013-05-27 MED ORDER — SULFAMETHOXAZOLE-TMP DS 800-160 MG PO TABS: 1.0000 | ORAL_TABLET | Freq: Two times a day (BID) | ORAL | Status: DC

## 2013-05-27 NOTE — Progress Notes (Signed)
Patient here for F/U left ear pain. States she finished the ear drops but continues with intermittent pain and pressure left ear. States continues to have difficulty hearing out of left ear.

## 2013-05-27 NOTE — Patient Instructions (Signed)
Buy some over the counter sudaphed---little red oills and take at least twice a day Start the antibiotic drops in your ear 4 times a day for7 days and the oral antibiotic for 7 days Return to clinic in 7-10

## 2013-05-27 NOTE — Progress Notes (Signed)
   Subjective:    Patient ID: Sandra Hahn, female    DOB: Apr 24, 1975, 38 y.o.   MRN: [id number]  HPI  Continued left ear pain. She used the drops in a seem to help for a few days but in the last couple of days she's had some drainage and increased pain. Also still having difficulty hearing from that ear.  Review of Systems No fever, sweats, chills. No rhinorrhea or cough    Objective:   Physical Exam Vital signs are reviewed GENERAL: Well-developed female no acute distress in HEENT: TM on the right is normal in appearance although somewhat retracted still has a good cone of light. TM on the left is bulging, dull. Neck is without lymphadenopathy oropharynx is without exudate       Assessment & Plan:  Otitis media area did not see any perforation in the eardrum but it is only so much am afraid he is going to person and. We'll place her on antibiotic ear drops as well as oral antibiotics and have her followup 7-10 days. Would also have her use some over-the-counter Sudafed to see if we can start equalize the pressure in her eustachian tube.

## 2013-05-28 NOTE — Progress Notes (Signed)
This encounter was created in error - please disregard.

## 2013-06-06 ENCOUNTER — Ambulatory Visit: Payer: No Typology Code available for payment source

## 2013-06-07 DIAGNOSIS — J9819 Other pulmonary collapse: Secondary | ICD-10-CM | POA: Diagnosis present

## 2013-06-07 DIAGNOSIS — E874 Mixed disorder of acid-base balance: Secondary | ICD-10-CM | POA: Diagnosis present

## 2013-06-07 DIAGNOSIS — R52 Pain, unspecified: Secondary | ICD-10-CM | POA: Diagnosis present

## 2013-06-07 DIAGNOSIS — R040 Epistaxis: Secondary | ICD-10-CM | POA: Diagnosis present

## 2013-06-07 DIAGNOSIS — R51 Headache: Secondary | ICD-10-CM | POA: Diagnosis present

## 2013-06-07 DIAGNOSIS — E039 Hypothyroidism, unspecified: Secondary | ICD-10-CM | POA: Diagnosis present

## 2013-06-07 DIAGNOSIS — J069 Acute upper respiratory infection, unspecified: Secondary | ICD-10-CM | POA: Diagnosis present

## 2013-06-07 DIAGNOSIS — R0789 Other chest pain: Secondary | ICD-10-CM | POA: Diagnosis not present

## 2013-06-07 DIAGNOSIS — T370X5A Adverse effect of sulfonamides, initial encounter: Secondary | ICD-10-CM | POA: Diagnosis present

## 2013-06-07 DIAGNOSIS — E46 Unspecified protein-calorie malnutrition: Secondary | ICD-10-CM | POA: Diagnosis present

## 2013-06-07 DIAGNOSIS — R000 Tachycardia, unspecified: Secondary | ICD-10-CM | POA: Diagnosis present

## 2013-06-07 DIAGNOSIS — R0682 Tachypnea, not elsewhere classified: Secondary | ICD-10-CM | POA: Diagnosis present

## 2013-06-07 DIAGNOSIS — L270 Generalized skin eruption due to drugs and medicaments taken internally: Principal | ICD-10-CM | POA: Diagnosis present

## 2013-06-07 DIAGNOSIS — H6690 Otitis media, unspecified, unspecified ear: Secondary | ICD-10-CM | POA: Diagnosis present

## 2013-06-07 DIAGNOSIS — Z8673 Personal history of transient ischemic attack (TIA), and cerebral infarction without residual deficits: Secondary | ICD-10-CM

## 2013-06-07 DIAGNOSIS — N179 Acute kidney failure, unspecified: Secondary | ICD-10-CM | POA: Diagnosis present

## 2013-06-07 DIAGNOSIS — Z8249 Family history of ischemic heart disease and other diseases of the circulatory system: Secondary | ICD-10-CM

## 2013-06-07 DIAGNOSIS — I10 Essential (primary) hypertension: Secondary | ICD-10-CM | POA: Diagnosis present

## 2013-06-07 DIAGNOSIS — H109 Unspecified conjunctivitis: Secondary | ICD-10-CM | POA: Diagnosis present

## 2013-06-07 DIAGNOSIS — D509 Iron deficiency anemia, unspecified: Secondary | ICD-10-CM | POA: Diagnosis present

## 2013-06-07 DIAGNOSIS — IMO0001 Reserved for inherently not codable concepts without codable children: Secondary | ICD-10-CM | POA: Diagnosis present

## 2013-06-08 ENCOUNTER — Inpatient Hospital Stay (HOSPITAL_COMMUNITY): Payer: No Typology Code available for payment source

## 2013-06-08 ENCOUNTER — Inpatient Hospital Stay (HOSPITAL_COMMUNITY)
Admission: EM | Admit: 2013-06-08 | Discharge: 2013-06-10 | DRG: 607 | Disposition: A | Discharge status: Home / Self Care | Payer: No Typology Code available for payment source | Attending: Family Medicine | Admitting: Family Medicine

## 2013-06-08 ENCOUNTER — Emergency Department (HOSPITAL_COMMUNITY): Payer: No Typology Code available for payment source

## 2013-06-08 ENCOUNTER — Encounter (HOSPITAL_COMMUNITY): Payer: Self-pay | Admitting: Emergency Medicine

## 2013-06-08 DIAGNOSIS — R946 Abnormal results of thyroid function studies: Secondary | ICD-10-CM

## 2013-06-08 DIAGNOSIS — R079 Chest pain, unspecified: Secondary | ICD-10-CM

## 2013-06-08 DIAGNOSIS — Z01419 Encounter for gynecological examination (general) (routine) without abnormal findings: Secondary | ICD-10-CM

## 2013-06-08 DIAGNOSIS — B059 Measles without complication: Secondary | ICD-10-CM

## 2013-06-08 DIAGNOSIS — D496 Neoplasm of unspecified behavior of brain: Secondary | ICD-10-CM

## 2013-06-08 DIAGNOSIS — B079 Viral wart, unspecified: Secondary | ICD-10-CM

## 2013-06-08 DIAGNOSIS — H6690 Otitis media, unspecified, unspecified ear: Secondary | ICD-10-CM

## 2013-06-08 DIAGNOSIS — Z1159 Encounter for screening for other viral diseases: Secondary | ICD-10-CM

## 2013-06-08 DIAGNOSIS — R1013 Epigastric pain: Secondary | ICD-10-CM

## 2013-06-08 DIAGNOSIS — J302 Other seasonal allergic rhinitis: Secondary | ICD-10-CM

## 2013-06-08 DIAGNOSIS — N970 Female infertility associated with anovulation: Secondary | ICD-10-CM

## 2013-06-08 DIAGNOSIS — L270 Generalized skin eruption due to drugs and medicaments taken internally: Secondary | ICD-10-CM

## 2013-06-08 DIAGNOSIS — E860 Dehydration: Secondary | ICD-10-CM

## 2013-06-08 DIAGNOSIS — M25552 Pain in left hip: Secondary | ICD-10-CM

## 2013-06-08 DIAGNOSIS — N179 Acute kidney failure, unspecified: Secondary | ICD-10-CM

## 2013-06-08 DIAGNOSIS — G459 Transient cerebral ischemic attack, unspecified: Secondary | ICD-10-CM

## 2013-06-08 DIAGNOSIS — M5126 Other intervertebral disc displacement, lumbar region: Secondary | ICD-10-CM

## 2013-06-08 DIAGNOSIS — B078 Other viral warts: Secondary | ICD-10-CM

## 2013-06-08 DIAGNOSIS — Z113 Encounter for screening for infections with a predominantly sexual mode of transmission: Secondary | ICD-10-CM

## 2013-06-08 DIAGNOSIS — R21 Rash and other nonspecific skin eruption: Secondary | ICD-10-CM

## 2013-06-08 DIAGNOSIS — E039 Hypothyroidism, unspecified: Secondary | ICD-10-CM

## 2013-06-08 DIAGNOSIS — G44229 Chronic tension-type headache, not intractable: Secondary | ICD-10-CM

## 2013-06-08 DIAGNOSIS — Q969 Turner's syndrome, unspecified: Secondary | ICD-10-CM

## 2013-06-08 DIAGNOSIS — IMO0001 Reserved for inherently not codable concepts without codable children: Secondary | ICD-10-CM

## 2013-06-08 DIAGNOSIS — J309 Allergic rhinitis, unspecified: Secondary | ICD-10-CM

## 2013-06-08 DIAGNOSIS — R502 Drug induced fever: Secondary | ICD-10-CM

## 2013-06-08 DIAGNOSIS — R7989 Other specified abnormal findings of blood chemistry: Secondary | ICD-10-CM

## 2013-06-08 LAB — CBC WITH DIFFERENTIAL/PLATELET
Basophils Absolute: 0 10*3/uL (ref 0.0–0.1)
Basophils Relative: 0 % (ref 0–1)
EOS PCT: 1 % (ref 0–5)
Eosinophils Absolute: 0.1 10*3/uL (ref 0.0–0.7)
HEMATOCRIT: 32.3 % — AB (ref 36.0–46.0)
Hemoglobin: 10.3 g/dL — ABNORMAL LOW (ref 12.0–15.0)
LYMPHS ABS: 0.5 10*3/uL — AB (ref 0.7–4.0)
LYMPHS PCT: 6 % — AB (ref 12–46)
MCH: 26.2 pg (ref 26.0–34.0)
MCHC: 31.9 g/dL (ref 30.0–36.0)
MCV: 82.2 fL (ref 78.0–100.0)
MONO ABS: 0.2 10*3/uL (ref 0.1–1.0)
Monocytes Relative: 3 % (ref 3–12)
NEUTROS ABS: 6.9 10*3/uL (ref 1.7–7.7)
Neutrophils Relative %: 90 % — ABNORMAL HIGH (ref 43–77)
Platelets: 208 10*3/uL (ref 150–400)
RBC: 3.93 MIL/uL (ref 3.87–5.11)
RDW: 14.3 % (ref 11.5–15.5)
WBC: 7.6 10*3/uL (ref 4.0–10.5)

## 2013-06-08 LAB — BASIC METABOLIC PANEL
BUN: 26 mg/dL — ABNORMAL HIGH (ref 6–23)
CHLORIDE: 104 meq/L (ref 96–112)
CO2: 18 meq/L — AB (ref 19–32)
Calcium: 8.9 mg/dL (ref 8.4–10.5)
Creatinine, Ser: 1.47 mg/dL — ABNORMAL HIGH (ref 0.50–1.10)
GFR calc non Af Amer: 44 mL/min — ABNORMAL LOW (ref >90)
GFR, EST AFRICAN AMERICAN: 51 mL/min — AB (ref >90)
Glucose, Bld: 102 mg/dL — ABNORMAL HIGH (ref 70–99)
POTASSIUM: 4.8 meq/L (ref 3.7–5.3)
SODIUM: 138 meq/L (ref 137–147)

## 2013-06-08 LAB — CBC
HCT: 29.8 % — ABNORMAL LOW (ref 36.0–46.0)
HEMOGLOBIN: 9.3 g/dL — AB (ref 12.0–15.0)
MCH: 25.8 pg — AB (ref 26.0–34.0)
MCHC: 31.2 g/dL (ref 30.0–36.0)
MCV: 82.5 fL (ref 78.0–100.0)
PLATELETS: 180 10*3/uL (ref 150–400)
RBC: 3.61 MIL/uL — AB (ref 3.87–5.11)
RDW: 14.3 % (ref 11.5–15.5)
WBC: 6.8 10*3/uL (ref 4.0–10.5)

## 2013-06-08 LAB — URINALYSIS, ROUTINE W REFLEX MICROSCOPIC
BILIRUBIN URINE: NEGATIVE
Glucose, UA: NEGATIVE mg/dL
Ketones, ur: NEGATIVE mg/dL
Leukocytes, UA: NEGATIVE
Nitrite: NEGATIVE
PROTEIN: 30 mg/dL — AB
Specific Gravity, Urine: 1.022 (ref 1.005–1.030)
UROBILINOGEN UA: 0.2 mg/dL (ref 0.0–1.0)
pH: 5 (ref 5.0–8.0)

## 2013-06-08 LAB — URINE MICROSCOPIC-ADD ON

## 2013-06-08 LAB — T4, FREE: Free T4: 1.05 ng/dL (ref 0.80–1.80)

## 2013-06-08 LAB — HEPATITIS PANEL, ACUTE
HCV Ab: NEGATIVE
HEP B C IGM: NONREACTIVE
Hep A IgM: NONREACTIVE
Hepatitis B Surface Ag: NEGATIVE

## 2013-06-08 LAB — HIV ANTIBODY (ROUTINE TESTING W REFLEX): HIV: NONREACTIVE

## 2013-06-08 LAB — T3, FREE: T3, Free: 2.6 pg/mL (ref 2.3–4.2)

## 2013-06-08 LAB — TSH: TSH: 14.09 u[IU]/mL — AB (ref 0.350–4.500)

## 2013-06-08 LAB — D-DIMER, QUANTITATIVE (NOT AT ARMC): D-Dimer, Quant: 2.05 ug/mL-FEU — ABNORMAL HIGH (ref 0.00–0.48)

## 2013-06-08 LAB — SEDIMENTATION RATE: Sed Rate: 83 mm/hr — ABNORMAL HIGH (ref 0–22)

## 2013-06-08 LAB — TROPONIN I

## 2013-06-08 LAB — CREATININE, SERUM
CREATININE: 1.54 mg/dL — AB (ref 0.50–1.10)
GFR, EST AFRICAN AMERICAN: 48 mL/min — AB (ref >90)
GFR, EST NON AFRICAN AMERICAN: 42 mL/min — AB (ref >90)

## 2013-06-08 LAB — PREGNANCY, URINE: Preg Test, Ur: NEGATIVE

## 2013-06-08 MED ORDER — SODIUM CHLORIDE 0.9 % IV BOLUS (SEPSIS)
500.0000 mL | Freq: Once | INTRAVENOUS | Status: AC
  Administered 2013-06-08: 500 mL via INTRAVENOUS

## 2013-06-08 MED ORDER — MORPHINE SULFATE 4 MG/ML IJ SOLN
4.0000 mg | Freq: Once | INTRAMUSCULAR | Status: AC
  Administered 2013-06-08: 4 mg via INTRAVENOUS
  Filled 2013-06-08: qty 1

## 2013-06-08 MED ORDER — PHENOL 1.4 % MT LIQD: 1.0000 | OROMUCOSAL | Status: DC | PRN

## 2013-06-08 MED ORDER — ACETAMINOPHEN 325 MG PO TABS
650.0000 mg | ORAL_TABLET | Freq: Once | ORAL | Status: AC
  Administered 2013-06-08: 650 mg via ORAL
  Filled 2013-06-08: qty 2

## 2013-06-08 MED ORDER — IOHEXOL 350 MG/ML SOLN
100.0000 mL | Freq: Once | INTRAVENOUS | Status: AC | PRN
  Administered 2013-06-08: 80 mL via INTRAVENOUS

## 2013-06-08 MED ORDER — HEPARIN SODIUM (PORCINE) 5000 UNIT/ML IJ SOLN
5000.0000 [IU] | Freq: Three times a day (TID) | INTRAMUSCULAR | Status: DC
  Filled 2013-06-08 (×4): qty 1

## 2013-06-08 MED ORDER — SODIUM CHLORIDE 0.9 % IV BOLUS (SEPSIS)
1000.0000 mL | Freq: Once | INTRAVENOUS | Status: AC
  Administered 2013-06-08: 1000 mL via INTRAVENOUS

## 2013-06-08 MED ORDER — ACETAMINOPHEN 325 MG PO TABS
650.0000 mg | ORAL_TABLET | Freq: Four times a day (QID) | ORAL | Status: DC | PRN
  Administered 2013-06-08 – 2013-06-10 (×2): 650 mg via ORAL
  Filled 2013-06-08 (×3): qty 2

## 2013-06-08 MED ORDER — BIOTENE DRY MOUTH MT LIQD
15.0000 mL | Freq: Two times a day (BID) | OROMUCOSAL | Status: DC
  Administered 2013-06-08 – 2013-06-10 (×5): 15 mL via OROMUCOSAL

## 2013-06-08 MED ORDER — DIPHENHYDRAMINE HCL 50 MG/ML IJ SOLN
12.5000 mg | Freq: Three times a day (TID) | INTRAMUSCULAR | Status: DC
  Administered 2013-06-08 – 2013-06-10 (×6): 12.5 mg via INTRAVENOUS
  Filled 2013-06-08 (×2): qty 1
  Filled 2013-06-08 (×3): qty 0.25
  Filled 2013-06-08: qty 1
  Filled 2013-06-08 (×2): qty 0.25
  Filled 2013-06-08 (×2): qty 1
  Filled 2013-06-08: qty 0.25

## 2013-06-08 MED ORDER — RIVAROXABAN 15 MG PO TABS
15.0000 mg | ORAL_TABLET | Freq: Two times a day (BID) | ORAL | Status: DC
  Administered 2013-06-08: 15 mg via ORAL
  Filled 2013-06-08 (×2): qty 1

## 2013-06-08 MED ORDER — OXYCODONE HCL 5 MG PO TABS
5.0000 mg | ORAL_TABLET | Freq: Four times a day (QID) | ORAL | Status: DC | PRN
  Administered 2013-06-08 – 2013-06-09 (×3): 5 mg via ORAL
  Filled 2013-06-08 (×3): qty 1

## 2013-06-08 MED ORDER — SODIUM CHLORIDE 0.9 % IV BOLUS (SEPSIS)
500.0000 mL | Freq: Once | INTRAVENOUS | Status: AC
Start: 2013-06-08 — End: 2013-06-08
  Administered 2013-06-08: 500 mL via INTRAVENOUS

## 2013-06-08 MED ORDER — OXYCODONE HCL 5 MG PO TABS
5.0000 mg | ORAL_TABLET | ORAL | Status: DC | PRN
  Administered 2013-06-08: 5 mg via ORAL
  Filled 2013-06-08: qty 1

## 2013-06-08 MED ORDER — ENOXAPARIN SODIUM 40 MG/0.4ML ~~LOC~~ SOLN: 40.0000 mg | SUBCUTANEOUS | Status: DC

## 2013-06-08 MED ORDER — FONDAPARINUX SODIUM 2.5 MG/0.5ML ~~LOC~~ SOLN
2.5000 mg | Freq: Every day | SUBCUTANEOUS | Status: DC
  Administered 2013-06-08: 2.5 mg via SUBCUTANEOUS
  Filled 2013-06-08 (×2): qty 0.5

## 2013-06-08 MED ORDER — MORPHINE SULFATE 2 MG/ML IJ SOLN
1.0000 mg | INTRAMUSCULAR | Status: DC | PRN
  Administered 2013-06-08 – 2013-06-10 (×3): 1 mg via INTRAVENOUS
  Filled 2013-06-08 (×3): qty 1

## 2013-06-08 MED ORDER — SODIUM CHLORIDE 0.9 % IV SOLN
INTRAVENOUS | Status: DC
  Administered 2013-06-08 (×2): via INTRAVENOUS
  Administered 2013-06-09: 125 mL/h via INTRAVENOUS
  Administered 2013-06-09 – 2013-06-10 (×3): via INTRAVENOUS

## 2013-06-08 MED ORDER — ACETAMINOPHEN 650 MG RE SUPP: 650.0000 mg | Freq: Four times a day (QID) | RECTAL | Status: DC | PRN

## 2013-06-08 MED ORDER — KETOROLAC TROMETHAMINE 30 MG/ML IJ SOLN
30.0000 mg | Freq: Once | INTRAMUSCULAR | Status: DC
  Filled 2013-06-08 (×2): qty 1

## 2013-06-08 MED ORDER — FONDAPARINUX SODIUM 7.5 MG/0.6ML ~~LOC~~ SOLN: 7.5000 mg | Freq: Every day | SUBCUTANEOUS | Status: DC

## 2013-06-08 MED ORDER — ONDANSETRON HCL 4 MG/2ML IJ SOLN: 4.0000 mg | Freq: Four times a day (QID) | INTRAMUSCULAR | Status: DC | PRN

## 2013-06-08 MED ORDER — DIPHENHYDRAMINE HCL 25 MG PO CAPS
25.0000 mg | ORAL_CAPSULE | Freq: Four times a day (QID) | ORAL | Status: DC | PRN
  Filled 2013-06-08: qty 1

## 2013-06-08 MED ORDER — ONDANSETRON HCL 4 MG PO TABS
4.0000 mg | ORAL_TABLET | Freq: Four times a day (QID) | ORAL | Status: DC | PRN
Start: 2013-06-08 — End: 2013-06-10

## 2013-06-08 NOTE — Progress Notes (Signed)
Patient arrived to the floor via stretcher from the ED.  She is able to ambulate to the bed with contact guard assist.  She is from [place] but speaks fluent [place].  She does have measles and is being placed on Airborne/Contact precautions in a negative pressure room.  She is oriented to the unit.  Unit handouts were given.  She is placed on Moderate Fall Risk Precautions and is in agreement with these.  She is Muslim and requests no pork derived products.  MD advised that heparin SQ was ordered and is a pork derived product.  New orders received.  She also requests for all liquids to be at room temperature.  She is from home with her husband.  She currently has no family members/friends at the bedside.  She does request no female caregivers excepts for doctors due to religious beliefs.  All questions answered.  Educated on the current contact precautions.  [employer] RN-BC, WTA.

## 2013-06-08 NOTE — ED Notes (Signed)
Pt denies any recent travel outside of the country, denies any other family members with similar symptoms. Pt does report, ear ache, having runny nose, pain, drainage, and waking every am with yellow crust to bilateral eyes. Pt seen by a physician several days ago and instructed to buy OTC Sudafed, pt states her symptoms started 3 days ago after finishing that medication.

## 2013-06-08 NOTE — Consult Note (Signed)
[facility]    Date of Admission:  06/08/2013  Date of Consult:  06/08/2013  Reason for Consult: rash, concern for Measles infection Referring Physician: Dr. Adrian Blackwater   HPI: Sandra Hahn is an 38 y.o. female who emigrated from [place] 4 years ago and has not been back since. She believes her vaccinations are all up to date and indeed fact that she was allowed to emigrate to [place] would assuredly not been allowed to happen absent proof of vaccination status.   She had symptoms of hearing loss left side of ear, and concerns her allergies were flaring. She was seen by her PCP on 05/20/13 , Dr. Skeet Simmer and given debrox but then retruned to see Dr. Nori Riis on 05/27/13 and was given Cortisporin ear drops and BaCTRIM TABLETS DS one tablet BID.   She then developed a rash on her back that then extended to her arms, legs and face. She had fever and muscle aches and watery eyeds. She came to ED where she was examined and ED MD believed he was seeing Koplik's spots on her buccal mucosa and suspicion for Measles was raised and pt admitted to [facility] service for further workup  Overnight she has already had improvement in her symptoms.  With close questioning I found that she lives alone in an apt off of [address] Her husband has been forced to return to the [place]. She has no children and no frequent contact with children. She is a Ship broker at [employer] and has had only one sick contact at [facility] which was another woman who was coughing during class a few wweeks ago.    Past Medical History  Diagnosis Date  . Cerebellar tumor 2005    s/p surgical resection and radiation in [place]  . Allergy   . Lumbar arthropathy 2011    Left paracentral disc protrusion at L5-S1 with potential contact of the S1 nerve roots bilaterally, more likely on the left    Past Surgical History  Procedure Laterality Date  . Tonsillectomy  1985  . Exploratory laparotomy  2009    Laparoscopy revealed lack of  ovaries bilaterally in [place]  . Brain surgery  2005    Cerebellar tumor resection  . Cerebellar tumor  2005    Dx and resected in 2005  ergies:   Allergies  Allergen Reactions  . Pork-Derived Products Other (See Comments)    Not a true allergy..Called patient. Advised patient of provider's approval for requested procedure, as well as any comments/instructions from provider.   Provided patient w/ verbal instructions concerning pre-, intra- and post-procedure preparation and instructions.  Patient verbalized understanding of the above.  Not a true allergy.  Patient is a Muslim and requests no pork derived products.     Medications: I have reviewed patients current medications as documented in Epic Anti-infectives   None      Social History:  reports that she has never smoked. She has never used smokeless tobacco. She reports that she does not drink alcohol or use illicit drugs.  Family History  Problem Relation Age of Onset  . Lumbar disc disease Mother   . Hypertension Father   . Diabetes Neg Hx   . Cancer Neg Hx   . Birth defects Neg Hx     As in HPI and primary teams notes otherwise 12 point review of systems is negative  Blood pressure 91/55, pulse 109, temperature 98.9 F (37.2 C), temperature source Oral, resp. rate 18,  height 5' 6"  (1.676 m), weight 181 lb 9 oz (82.356 kg), last menstrual period 05/23/2013, SpO2 98.00%.  General: Alert and awake, oriented x3, not in any acute distress. HEENT: anicteric sclera, pupils reactive to light and accommodation, EOMI, oropharynx clear and without exudate CVS regular rate, normal r,  no murmur rubs or gallops Chest: clear to auscultation bilaterally, no wheezing, rales or rhonchi Abdomen: soft nontender, nondistended, normal bowel sounds, Extremities: no  clubbing or edema noted bilaterally  Neuro: nonfocal, strength and sensation intact  Very faint subtle rash, almost a follicular promienence on arms, back and  face  Slight macule isolated see below       Another macule see below        RIght buccal mucosa: I DO NOT see Koplik's spots    Left buccal mucosa I also do not see Koplik's spots here either       Results for orders placed during the hospital encounter of 06/08/13 (from the past 48 hour(s))  BASIC METABOLIC PANEL     Status: Abnormal   Collection Time    06/08/13  2:49 AM      Result Value Ref Range   Sodium 138  137 - 147 mEq/L   Potassium 4.8  3.7 - 5.3 mEq/L   Chloride 104  96 - 112 mEq/L   CO2 18 (*) 19 - 32 mEq/L   Glucose, Bld 102 (*) 70 - 99 mg/dL   BUN 26 (*) 6 - 23 mg/dL   Creatinine, Ser 1.47 (*) 0.50 - 1.10 mg/dL   Calcium 8.9  8.4 - 10.5 mg/dL   GFR calc non Af Amer 44 (*) >90 mL/min   GFR calc Af Amer 51 (*) >90 mL/min   Comment: (NOTE)     The eGFR has been calculated using the CKD EPI equation.     This calculation has not been validated in all clinical situations.     eGFR's persistently <90 mL/min signify possible Chronic Kidney     Disease.  CBC WITH DIFFERENTIAL     Status: Abnormal   Collection Time    06/08/13  2:49 AM      Result Value Ref Range   WBC 7.6  4.0 - 10.5 K/uL   RBC 3.93  3.87 - 5.11 MIL/uL   Hemoglobin 10.3 (*) 12.0 - 15.0 g/dL   HCT 32.3 (*) 36.0 - 46.0 %   MCV 82.2  78.0 - 100.0 fL   MCH 26.2  26.0 - 34.0 pg   MCHC 31.9  30.0 - 36.0 g/dL   RDW 14.3  11.5 - 15.5 %   Platelets 208  150 - 400 K/uL   Neutrophils Relative % 90 (*) 43 - 77 %   Neutro Abs 6.9  1.7 - 7.7 K/uL   Lymphocytes Relative 6 (*) 12 - 46 %   Lymphs Abs 0.5 (*) 0.7 - 4.0 K/uL   Monocytes Relative 3  3 - 12 %   Monocytes Absolute 0.2  0.1 - 1.0 K/uL   Eosinophils Relative 1  0 - 5 %   Eosinophils Absolute 0.1  0.0 - 0.7 K/uL   Basophils Relative 0  0 - 1 %   Basophils Absolute 0.0  0.0 - 0.1 K/uL  SEDIMENTATION RATE     Status: Abnormal   Collection Time    06/08/13  2:49 AM      Result Value Ref Range   Sed Rate 83 (*) 0 - 22 mm/hr   URINALYSIS, ROUTINE  W REFLEX MICROSCOPIC     Status: Abnormal   Collection Time    06/08/13  6:14 AM      Result Value Ref Range   Color, Urine YELLOW  YELLOW   APPearance CLOUDY (*) CLEAR   Specific Gravity, Urine 1.022  1.005 - 1.030   pH 5.0  5.0 - 8.0   Glucose, UA NEGATIVE  NEGATIVE mg/dL   Hgb urine dipstick SMALL (*) NEGATIVE   Bilirubin Urine NEGATIVE  NEGATIVE   Ketones, ur NEGATIVE  NEGATIVE mg/dL   Protein, ur 30 (*) NEGATIVE mg/dL   Urobilinogen, UA 0.2  0.0 - 1.0 mg/dL   Nitrite NEGATIVE  NEGATIVE   Leukocytes, UA NEGATIVE  NEGATIVE  PREGNANCY, URINE     Status: None   Collection Time    06/08/13  6:14 AM      Result Value Ref Range   Preg Test, Ur NEGATIVE  NEGATIVE   Comment:            THE SENSITIVITY OF THIS     METHODOLOGY IS >20 mIU/mL.  URINE MICROSCOPIC-ADD ON     Status: Abnormal   Collection Time    06/08/13  6:14 AM      Result Value Ref Range   Squamous Epithelial / LPF RARE  RARE   WBC, UA 3-6  <3 WBC/hpf   RBC / HPF 3-6  <3 RBC/hpf   Bacteria, UA FEW (*) RARE   Casts HYALINE CASTS (*) NEGATIVE  CBC     Status: Abnormal   Collection Time    06/08/13  8:41 AM      Result Value Ref Range   WBC 6.8  4.0 - 10.5 K/uL   RBC 3.61 (*) 3.87 - 5.11 MIL/uL   Hemoglobin 9.3 (*) 12.0 - 15.0 g/dL   HCT 29.8 (*) 36.0 - 46.0 %   MCV 82.5  78.0 - 100.0 fL   MCH 25.8 (*) 26.0 - 34.0 pg   MCHC 31.2  30.0 - 36.0 g/dL   RDW 14.3  11.5 - 15.5 %   Platelets 180  150 - 400 K/uL  CREATININE, SERUM     Status: Abnormal   Collection Time    06/08/13  8:41 AM      Result Value Ref Range   Creatinine, Ser 1.54 (*) 0.50 - 1.10 mg/dL   GFR calc non Af Amer 42 (*) >90 mL/min   GFR calc Af Amer 48 (*) >90 mL/min   Comment: (NOTE)     The eGFR has been calculated using the CKD EPI equation.     This calculation has not been validated in all clinical situations.     eGFR's persistently <90 mL/min signify possible Chronic Kidney     Disease.  TSH     Status: Abnormal    Collection Time    06/08/13  8:41 AM      Result Value Ref Range   TSH 14.090 (*) 0.350 - 4.500 uIU/mL   No results found for this basename: sdes, specrequest, cult, reptstatus   Dg Chest Portable 1 View  06/08/2013   CLINICAL DATA:  Rash and generalized body aches.  EXAM: PORTABLE CHEST - 1 VIEW  COMPARISON:  None.  FINDINGS: The cardiac silhouette, mediastinal and hilar contours are within normal limits. Streaky bibasilar atelectasis but no infiltrates, edema or effusions. The bony thorax is intact.  IMPRESSION: Streaky bibasilar atelectasis but no infiltrates or edema.   Electronically Signed   By: Kalman Jewels  M.D.   On: 06/08/2013 02:57     No results found for this or any previous visit (from the past 720 hour(s)).   Impression/Recommendation  Active Problems:   Measles   Thedora Sefcik is a 38 y.o. female with  Rash and fever that developed while taking DS bactrim for otitis along with cortisporin ear drops. She also developed itchy watery eyes, and sensation of sores in her mouth along with myalgias and there was concern for Measles  #1 Fever and rash: VERY LIKELY reaction to Sulfa and Bactrim   Epidemiologically I do not see any risk for Measles. She was undoubtedly vaccinated or with prior immunity. She appears by hx to be fairly reclusive, living alone and attending [employer]. She has only had one sick contact who was another Ship broker at [employer].  I DO NOT find the pattern or progression of rash to be suggestive of Measles. I did not find Kopliks spots  I would observe her off TMP/SMX  I would cc airborne isolation  I spent greater than 60 minutes with the patient including greater than 50% of time in face to face counsel of the patient and in coordination of their care.  #2 Screening: check HIV, Hep panel and RPR  Dr. Baxter Flattery will be here tomorrow.   06/08/2013, 10:49 AM   Thank you so much for this interesting consult  [place] [place] [phone] (pager) [phone] (office) 06/08/2013, 10:49 AM  [place] 06/08/2013, 10:49 AM

## 2013-06-08 NOTE — H&P (Signed)
[facility] Admission History and Physical Service Pager: [phone]  Patient name: Sandra Hahn Medical record number: [id number] Date of birth: April 23, 1975 Age: 38 y.o. Gender: female  Primary Care Provider: Langston Masker, MD Consultants: None Code Status: Full Code  Chief Complaint: Rash, Body aches/pain, Fever  Assessment and Plan: Sandra Hahn is a 38 y.o. female originally from [place] who presents with fever, rash, and diffuse myalgias. Physical exam and history concerning for Measles. PMH is significant for abnormal TSH, Hx of cerebellar tumor s/p resection, and TIA.  Fever, Maculopapular Rash, Myalgias - With ? Koplik spots, Coryza, Conjunctivitis, and unclear immunization history there is concern for Measles.   This could be secondary to underlying viral illness (other than measles) but history is concerning.   - Admit to Med-Surg, Attending Dr. Adrian Blackwater - Rubeola IgM pending.  Droplet precautions. - Supportive care: antipyretics, IV fluids, pain control.  Per CDC, patient is a high risk for complications (if this is indeed measles) given age. - Will monitor very closely. - Pain Control: Oxycodone 5 mg Q6 PRN, Morphine 1 mg Q4PRN  AKI - Normal baseline creatinine. - Creatinine elevated at 1.47 on admission, likely secondary to poor PO intake/volume depletion in the setting of acute illness. - Additional 1 L NS bolus followed by NS @ 125 mL/hr - Daily BMP  FEN/GI: Fluids as above. Regular diet. Prophylaxis: Heparin SQ  Disposition: Pending clinical improvement  History of Present Illness:  Sandra Hahn is a 38 y.o. female with a PMH of hypothyroidism, Hx of cerebellar tumor s/p resection, and TIA who presents with 3-4 history of rash, diffuse body aches/myalgias and subjective fever.  Patient reports that she's been feeling poorly over the past 3-4 days.  She states that she first developed rash and then developed fever and diffuse bodyaches  and myalgias.  No exacerbating or relieving factors.  Associated symptoms: Nasal congestion/rhinorrhea, nausea, emesis x1, red/itchy eyes.  She denies any associated cough, chest pain, shortness of breath.  No recent travel. She reports that she received immunizations as a child.   Review Of Systems: Per HPI. Otherwise 12 point review of systems was performed and was unremarkable.  Patient Active Problem List   Diagnosis Date Noted  . Hip pain, left 05/07/2013  . Abdominal pain, epigastric 07/08/2012  . Abnormal TSH 07/08/2012  . Unspecified hypothyroidism 06/21/2012  . TIA (transient ischemic attack) 06/19/2012  . Gonadal dysgenesis 08/17/2011  . Female infertility associated with anovulation 08/08/2011  . Verruca vulgaris 08/08/2011  . Allergic rhinitis 08/08/2011  . Well woman exam with routine gynecological exam 08/08/2011  . Lumbar disc herniation 07/05/2011  . Chronic tension headaches 07/05/2011  . Cerebellar tumor 02/14/2011   Past Medical History: Past Medical History  Diagnosis Date  . Cerebellar tumor 2005    s/p surgical resection and radiation in [place]  . Allergy   . Lumbar arthropathy 2011    Left paracentral disc protrusion at L5-S1 with potential contact of the S1 nerve roots bilaterally, more likely on the left   Past Surgical History: Past Surgical History  Procedure Laterality Date  . Tonsillectomy  1985  . Exploratory laparotomy  2009    Laparoscopy revealed lack of ovaries bilaterally in [place]  . Brain surgery  2005    Cerebellar tumor resection  . Cerebellar tumor  2005    Dx and resected in 2005   Social History: History  Substance Use Topics  . Smoking status: Never Smoker   . Smokeless  tobacco: Never Used  . Alcohol Use: No   Family History: Family History  Problem Relation Age of Onset  . Lumbar disc disease Mother   . Hypertension Father   . Diabetes Neg Hx   . Cancer Neg Hx   . Birth defects Neg Hx    Allergies and  Medications: No Known Allergies No current facility-administered medications on file prior to encounter.   Current Outpatient Prescriptions on File Prior to Encounter  Medication Sig Dispense Refill  . neomycin-polymyxin-hydrocortisone (CORTISPORIN) otic solution Place 3 drops into the left ear 4 (four) times daily.  10 mL  1  . sulfamethoxazole-trimethoprim (BACTRIM DS) 800-160 MG per tablet Take 1 tablet by mouth 2 (two) times daily.  14 tablet  0    Objective: BP 104/54  Pulse 107  Temp(Src) 100.7 F (38.2 C) (Oral)  Resp 18  Wt 181 lb 9 oz (82.356 kg)  SpO2 100%  LMP 05/23/2013 Exam: General: well developed, well nourished.  Appears ill and uncomfortable. HEENT: NCAT. Dry mucous membranes.  Could not appreciated suspected Koplik spots identified by EDP.  Conjunctival injected noted bilaterally.  Cardiovascular: Tachycardic, Regular rhythm. No murmur noted. Respiratory: CTAB. No rales, rhonchi, or wheezing noted.  Abdomen: soft, nontender, nondistended.  Extremities: No LE edema. Skin: erythematous maculopapular rash noted - primarily on the L back and Left arm but also noted on the face and lower extremities. Neuro: AO x 3.  No focal deficits.   Labs and Imaging: CBC BMET   Recent Labs Lab 06/08/13 0249  WBC 7.6  HGB 10.3*  HCT 32.3*  PLT 208    Recent Labs Lab 06/08/13 0249  NA 138  K 4.8  CL 104  CO2 18*  BUN 26*  CREATININE 1.47*  GLUCOSE 102*  CALCIUM 8.9     Micro:  Blood Culture x 2 - Pending  Rubeola IgM - pending.   Coral Spikes, DO 06/08/2013, 4:57 AM PGY-2 [facility] pager: [phone], text pages welcome

## 2013-06-08 NOTE — Progress Notes (Signed)
Entered patients room, patient experiencing pain within chest upon deep inhalation as well as blood pressure of 90/53 pulse of 101.  Family medicine paged and informed of patients current symptoms and blood pressure.  Orders received for 12-lead EKG, 500cc Bolus, and troponins.  Orders to be carried out.  Patient currently describing pain as a discomfort felt upon deep inhalation.  Will continue to monitor patient.   Dirk Dress 06/08/2013

## 2013-06-08 NOTE — Progress Notes (Signed)
.  Sandra Hahn is a 38 y.o. su[place] female which presented to the ED last night with rash, conjunctivitis, cough, coryza, myalgia and skin eruption. ED note positive for Koplik spots. Patient was admitted to FMTS for possible measles outbreak. Pt vitals are with soft BP (89/44) and tachycardia (109). She remains afebrile.  A/P: - Consulted ID for possible measles. Supportive care with IV fluids, tylenol and benadryl for itching. Consider possible drug eruption from Bactrim use (on home meds).  - Airborne precautions.  - Pt declines pork derived products for DVT prophylaxis. Pt has history of TIA and not taking daily ASA. Arixtra 2.5 mg QD for porphylaxis.  - Blood cultures in process (WBC nl and afebrile, with soft pressures and tachycardia). CXR was normal, UA nl. IV fluid bolus for AKI and soft pressures.

## 2013-06-08 NOTE — Progress Notes (Signed)
Patient verbalized slight improvement in chest pain upon deep inhalation with oxycodone administration.  First troponin negative, EKG sinus tachycardia, 500 cc bolus administered.  Will continue to monitor patient.  No s/s of distress noted throughout currently.  Dirk Dress 06/08/2013

## 2013-06-08 NOTE — ED Notes (Signed)
Pt presents with a rash to her upper back and generalized body aches x3 days. Pt described her rash as red bumps that are itchy and painful.

## 2013-06-08 NOTE — H&P (Signed)
Attending Addendum  I examined the patient and discussed the assessment and plan with Dr. Lacinda Axon. I have reviewed the note and agree.  Briefly, 38 yo F originally from [place] with two weeks or rash and 2 days of malaise. Rash started on back. Spread to arms, legs and face. Patient denies new medications, sick contacts with rash. Upon chart review she was treated with bactrim about 12 days ago for otitis media. She was seen in ED and her exam was concerning for koplik spots on oral mucosa raising concern for measles. Her husband lives overseas. She has no children. No recent travel. Plans to travel to [place] to visit her sister in the near future.  ROS: malaise, sore throat, pruritus, HA (chronic HAs). Denies fever, cough.   BP 90/53  Pulse 101  Temp(Src) 97.7 F (36.5 C) (Oral)  Resp 19  Ht 5\' 6"  (1.676 m)  Wt 181 lb 9 oz (82.356 kg)  BMI 29.32 kg/m2  SpO2 100%  LMP 05/23/2013 General appearance: alert, appears ills, no distress Eyes: mild conjunctival injection Throat: no oral lesions  Chest: clear, normal WOB Skin: fine diffuse slightly raised papular rash on face, arms, legs  A/P: Rash and generalized illness. Drug reaction vs measles. Drug reaction more likely.  Appreciated ID consult.  F/u blood cultures and measles IgM Symptomatic care.   Boykin Nearing, [place]

## 2013-06-08 NOTE — ED Provider Notes (Signed)
CSN: [id number]     Arrival date & time 06/07/13  2357 History   First MD Initiated Contact with Patient 06/08/13 0201     Chief Complaint  Patient presents with  . Rash  . Generalized Body Aches     (Consider location/radiation/quality/duration/timing/severity/associated sxs/prior Treatment) HPI] 38 yo [place] immigrant here with 3 days of body rash, subjective fever and myalgias & arthralgias. She also endorses dysuria. Rash began on the patient's chest and back and progressed to involve the abdomen. Patient has had nasal congestion and runny eyes. She has been using sudafed for that.   She says she vomited once 3 days ago. None since. No abdominal pain, chest pain or SOB. No diarrhea. No contacts with similar sx. No recent international travel. Patient states that she received immunizations as a child but, does not know which immunizations.   Past Medical History  Diagnosis Date  . Cerebellar tumor 2005    s/p surgical resection and radiation in [place]  . Allergy   . Lumbar arthropathy 2011    Left paracentral disc protrusion at L5-S1 with potential contact of the S1 nerve roots bilaterally, more likely on the left   Past Surgical History  Procedure Laterality Date  . Tonsillectomy  1985  . Exploratory laparotomy  2009    Laparoscopy revealed lack of ovaries bilaterally in [place]  . Brain surgery  2005    Cerebellar tumor resection  . Cerebellar tumor  2005    Dx and resected in 2005   Family History  Problem Relation Age of Onset  . Lumbar disc disease Mother   . Hypertension Father   . Diabetes Neg Hx   . Cancer Neg Hx   . Birth defects Neg Hx    History  Substance Use Topics  . Smoking status: Never Smoker   . Smokeless tobacco: Never Used  . Alcohol Use: No   OB History   Grav Para Term Preterm Abortions TAB SAB Ect Mult Living                 Review of Systems Ten point review of symptoms performed and is negative with the exception of symptoms noted  above.     Allergies  Review of patient's allergies indicates no known allergies.  Home Medications   Prior to Admission medications   Medication Sig Start Date End Date Taking? Authorizing Provider  neomycin-polymyxin-hydrocortisone (CORTISPORIN) otic solution Place 3 drops into the left ear 4 (four) times daily. 05/27/13  Yes Dickie La, MD  sulfamethoxazole-trimethoprim (BACTRIM DS) 800-160 MG per tablet Take 1 tablet by mouth 2 (two) times daily. 05/27/13  Yes Dickie La, MD   BP 123/77  Pulse 112  Temp(Src) 98.1 F (36.7 C) (Oral)  Resp 20  Wt 181 lb 9 oz (82.356 kg)  SpO2 100%  LMP 05/23/2013 Physical Exam Gen: well developed and well nourished appearing, uncomfortable appearing, moaning Head: NCAT Eyes: PERL, EOMI, conjunctiva injected bilaterally.  Nose: no epistaixis or rhinorrhea Mouth/throat: mucosa is moist and pink, suspected Koplik spots over buccal mucosa bilaterally - more prominent on right than left, also present on tip and lateral margins of distal tongue, single lesion over mucosal surface of lower lip.  Neck: supple, no stridor Lungs: CTA B, no wheezing, rhonchi or rales CV: rapid and regular, pulse 100 bpm, no murmur, extremities appear well perfused.  Abd: soft, notender, nondistended Back: no ttp, no cva ttp Skin: warm and dry Ext: normal to inspection, no dependent  edema Neuro: CN ii-xii grossly intact, no focal deficits Psyche; normal affect,  calm and cooperative.   ED Course  Procedures (including critical care time) Labs Review  Results for orders placed during the hospital encounter of 06/08/13 (from the past 24 hour(s))  BASIC METABOLIC PANEL     Status: Abnormal   Collection Time    06/08/13  2:49 AM      Result Value Ref Range   Sodium 138  137 - 147 mEq/L   Potassium 4.8  3.7 - 5.3 mEq/L   Chloride 104  96 - 112 mEq/L   CO2 18 (*) 19 - 32 mEq/L   Glucose, Bld 102 (*) 70 - 99 mg/dL   BUN 26 (*) 6 - 23 mg/dL   Creatinine, Ser 1.47  (*) 0.50 - 1.10 mg/dL   Calcium 8.9  8.4 - 10.5 mg/dL   GFR calc non Af Amer 44 (*) >90 mL/min   GFR calc Af Amer 51 (*) >90 mL/min  CBC WITH DIFFERENTIAL     Status: Abnormal   Collection Time    06/08/13  2:49 AM      Result Value Ref Range   WBC 7.6  4.0 - 10.5 K/uL   RBC 3.93  3.87 - 5.11 MIL/uL   Hemoglobin 10.3 (*) 12.0 - 15.0 g/dL   HCT 32.3 (*) 36.0 - 46.0 %   MCV 82.2  78.0 - 100.0 fL   MCH 26.2  26.0 - 34.0 pg   MCHC 31.9  30.0 - 36.0 g/dL   RDW 14.3  11.5 - 15.5 %   Platelets 208  150 - 400 K/uL   Neutrophils Relative % 90 (*) 43 - 77 %   Neutro Abs 6.9  1.7 - 7.7 K/uL   Lymphocytes Relative 6 (*) 12 - 46 %   Lymphs Abs 0.5 (*) 0.7 - 4.0 K/uL   Monocytes Relative 3  3 - 12 %   Monocytes Absolute 0.2  0.1 - 1.0 K/uL   Eosinophils Relative 1  0 - 5 %   Eosinophils Absolute 0.1  0.0 - 0.7 K/uL   Basophils Relative 0  0 - 1 %   Basophils Absolute 0.0  0.0 - 0.1 K/uL    I DG Chest Portable 1 View (Final result)  Result time: 06/08/13 02:57:27    Final result by Rad Results In Interface (06/08/13 02:57:27)    Narrative:   CLINICAL DATA: Rash and generalized body aches.  EXAM: PORTABLE CHEST - 1 VIEW  COMPARISON: None.  FINDINGS: The cardiac silhouette, mediastinal and hilar contours are within normal limits. Streaky bibasilar atelectasis but no infiltrates, edema or effusions. The bony thorax is intact.  IMPRESSION: Streaky bibasilar atelectasis but no infiltrates or edema.   Electronically Signed By: Kalman Jewels M.D. On: 06/08/2013 02:57         MDM   Patient with clinical history and PE findings consistent with measles. Unknown vaccination history. We have placed the patient on droplet precautions. Patient has AKI. We are treating with fluid resuscitation along with analgesia. IgM abx for rubeola sent. Plan to admit.    Elyn Peers, MD 06/08/13 [phone]

## 2013-06-08 NOTE — ED Notes (Signed)
Pt. reports itchy rashes at mid/lower back and body aches onset 3 days ago , respirations unlabored / denies fever.

## 2013-06-08 NOTE — Progress Notes (Signed)
ANTICOAGULATION CONSULT NOTE - Initial Consult  Pharmacy Consult for xarelto  Indication : /r/o PE   Allergies  Allergen Reactions  . Pork-Derived Products Other (See Comments)    Not a true allergy..Called patient. Advised patient of provider's approval for requested procedure, as well as any comments/instructions from provider.   Provided patient w/ verbal instructions concerning pre-, intra- and post-procedure preparation and instructions.  Patient verbalized understanding of the above.  Not a true allergy.  Patient is a Muslim and requests no pork derived products.    Patient Measurements: Height: 5\' 6"  (167.6 cm) Weight: 181 lb 9 oz (82.356 kg) IBW/kg (Calculated) : 59.3 Heparin Dosing Weight:   Vital Signs: Temp: 99.7 F (37.6 C) (06/07 2256) Temp src: Oral (06/07 2256) BP: 99/55 mmHg (06/07 2256) Pulse Rate: 92 (06/07 2256)  Labs:  Recent Labs  06/08/13 0249 06/08/13 0841 06/08/13 1652  HGB 10.3* 9.3*  --   HCT 32.3* 29.8*  --   PLT 208 180  --   CREATININE 1.47* 1.54*  --   TROPONINI  --   --  <0.30    Estimated Creatinine Clearance: 53.6 ml/min (by C-G formula based on Cr of 1.54).   Medical History: Past Medical History  Diagnosis Date  . Cerebellar tumor 2005    s/p surgical resection and radiation in [place]  . Allergy   . Lumbar arthropathy 2011    Left paracentral disc protrusion at L5-S1 with potential contact of the S1 nerve roots bilaterally, more likely on the left    Medications:  Prescriptions prior to admission  Medication Sig Dispense Refill  . neomycin-polymyxin-hydrocortisone (CORTISPORIN) otic solution Place 3 drops into the left ear 4 (four) times daily.  10 mL  1  . sulfamethoxazole-trimethoprim (BACTRIM DS) 800-160 MG per tablet Take 1 tablet by mouth 2 (two) times daily.  14 tablet  0    Assessment: 38 yo [place]ese female with measles vs drug reaction to septra (most likely drug rash per ID) now with pain on inspiration  and elevated d-dimer of 2.05 and negative troponin. Highly likelihood of PE. Xarelto to begin tonight. CT results pending. Received arixtra 2.5 today for dvt ppx. Pt is Muslim and desires no pork products. With high probability of PE MD wishes to begin full anticoag asap.  Goal of Therapy:  xarelto dose for PE.  Monitor platelets by anticoagulation protocol: Yes   Plan:  xarelto 15 mg bid for 21 days and then 20 mg daily.  Cbc daily  F/u CT results for continuation  Curlene Dolphin 06/08/2013,11:16 PM

## 2013-06-09 DIAGNOSIS — R509 Fever, unspecified: Secondary | ICD-10-CM

## 2013-06-09 DIAGNOSIS — R079 Chest pain, unspecified: Secondary | ICD-10-CM

## 2013-06-09 DIAGNOSIS — I10 Essential (primary) hypertension: Secondary | ICD-10-CM

## 2013-06-09 DIAGNOSIS — L270 Generalized skin eruption due to drugs and medicaments taken internally: Secondary | ICD-10-CM | POA: Diagnosis present

## 2013-06-09 DIAGNOSIS — G44229 Chronic tension-type headache, not intractable: Secondary | ICD-10-CM

## 2013-06-09 LAB — URINALYSIS, ROUTINE W REFLEX MICROSCOPIC
Bilirubin Urine: NEGATIVE
Glucose, UA: NEGATIVE mg/dL
Ketones, ur: NEGATIVE mg/dL
Leukocytes, UA: NEGATIVE
Nitrite: NEGATIVE
Protein, ur: 30 mg/dL — AB
Specific Gravity, Urine: 1.015 (ref 1.005–1.030)
UROBILINOGEN UA: 0.2 mg/dL (ref 0.0–1.0)
pH: 6 (ref 5.0–8.0)

## 2013-06-09 LAB — TROPONIN I
Troponin I: <0.3 ng/mL (ref <0.30)
Troponin I: <0.3 ng/mL (ref <0.30)
Troponin I: <0.3 ng/mL (ref <0.30)

## 2013-06-09 LAB — COMPREHENSIVE METABOLIC PANEL
ALK PHOS: 60 U/L (ref 39–117)
ALT: 20 U/L (ref 0–35)
AST: 22 U/L (ref 0–37)
Albumin: 2.4 g/dL — ABNORMAL LOW (ref 3.5–5.2)
BUN: 18 mg/dL (ref 6–23)
CHLORIDE: 108 meq/L (ref 96–112)
CO2: 16 mEq/L — ABNORMAL LOW (ref 19–32)
Calcium: 7.6 mg/dL — ABNORMAL LOW (ref 8.4–10.5)
Creatinine, Ser: 1.23 mg/dL — ABNORMAL HIGH (ref 0.50–1.10)
GFR calc non Af Amer: 55 mL/min — ABNORMAL LOW (ref >90)
GFR, EST AFRICAN AMERICAN: 64 mL/min — AB (ref >90)
GLUCOSE: 125 mg/dL — AB (ref 70–99)
POTASSIUM: 4.2 meq/L (ref 3.7–5.3)
SODIUM: 137 meq/L (ref 137–147)
TOTAL PROTEIN: 5.9 g/dL — AB (ref 6.0–8.3)

## 2013-06-09 LAB — RESPIRATORY VIRUS PANEL
ADENOVIRUS: NOT DETECTED
INFLUENZA A H1: NOT DETECTED
INFLUENZA A H3: NOT DETECTED
Influenza A: NOT DETECTED
Influenza B: NOT DETECTED
METAPNEUMOVIRUS: NOT DETECTED
PARAINFLUENZA 3 A: NOT DETECTED
Parainfluenza 1: NOT DETECTED
Parainfluenza 2: NOT DETECTED
RESPIRATORY SYNCYTIAL VIRUS A: NOT DETECTED
RHINOVIRUS: NOT DETECTED
Respiratory Syncytial Virus B: NOT DETECTED

## 2013-06-09 LAB — CBC WITH DIFFERENTIAL/PLATELET
Basophils Absolute: 0 10*3/uL (ref 0.0–0.1)
Basophils Relative: 0 % (ref 0–1)
EOS ABS: 0.3 10*3/uL (ref 0.0–0.7)
Eosinophils Relative: 5 % (ref 0–5)
HCT: 26.7 % — ABNORMAL LOW (ref 36.0–46.0)
HEMOGLOBIN: 8.4 g/dL — AB (ref 12.0–15.0)
LYMPHS ABS: 1.1 10*3/uL (ref 0.7–4.0)
LYMPHS PCT: 21 % (ref 12–46)
MCH: 25.8 pg — ABNORMAL LOW (ref 26.0–34.0)
MCHC: 31.5 g/dL (ref 30.0–36.0)
MCV: 82.2 fL (ref 78.0–100.0)
Monocytes Absolute: 0.3 10*3/uL (ref 0.1–1.0)
Monocytes Relative: 6 % (ref 3–12)
NEUTROS ABS: 3.5 10*3/uL (ref 1.7–7.7)
NEUTROS PCT: 68 % (ref 43–77)
PLATELETS: 171 10*3/uL (ref 150–400)
RBC: 3.25 MIL/uL — AB (ref 3.87–5.11)
RDW: 14.7 % (ref 11.5–15.5)
WBC: 5.2 10*3/uL (ref 4.0–10.5)

## 2013-06-09 LAB — RPR

## 2013-06-09 LAB — MRSA PCR SCREENING: MRSA BY PCR: NEGATIVE

## 2013-06-09 LAB — URINE MICROSCOPIC-ADD ON

## 2013-06-09 LAB — LACTIC ACID, PLASMA: Lactic Acid, Venous: 1.1 mmol/L (ref 0.5–2.2)

## 2013-06-09 MED ORDER — FONDAPARINUX SODIUM 2.5 MG/0.5ML ~~LOC~~ SOLN
2.5000 mg | Freq: Every day | SUBCUTANEOUS | Status: DC
  Filled 2013-06-09: qty 0.5

## 2013-06-09 MED ORDER — FONDAPARINUX SODIUM 2.5 MG/0.5ML ~~LOC~~ SOLN
2.5000 mg | Freq: Every day | SUBCUTANEOUS | Status: DC
  Administered 2013-06-10: 2.5 mg via SUBCUTANEOUS
  Filled 2013-06-09 (×3): qty 0.5

## 2013-06-09 NOTE — Progress Notes (Signed)
Discusssed in rounds.  Agree with Dr. Marthann Schiller documentation and management.

## 2013-06-09 NOTE — Care Management Note (Signed)
    Page 1 of 1   06/09/2013     11:50:57 AM CARE MANAGEMENT NOTE 06/09/2013  Patient:  SHONTA, BOURQUE   Account Number:  [id number]  Date Initiated:  06/09/2013  Documentation initiated by:  Elissa Hefty  Subjective/Objective Assessment:   adm w rash     Action/Plan:   lives w husband, pcp dr Langston Masker   Anticipated DC Date:     Anticipated DC Plan:  HOME/SELF CARE         Choice offered to / List presented to:             Status of service:   Medicare Important Message given?   (If response is "NO", the following Medicare IM given date fields will be blank) Date Medicare IM given:   Date Additional Medicare IM given:    Discharge Disposition:  HOME/SELF CARE  Per UR Regulation:  Reviewed for med. necessity/level of care/duration of stay  If discussed at [place] of Stay Meetings, dates discussed:    Comments:

## 2013-06-09 NOTE — Progress Notes (Signed)
Sandra Hahn is a 38 y.o. [place] female that presented lat last night for possible measles outbreak (not likely per ID). Pt has two events of chest pain, that appeared pleuritic in nature. EKG with mild ST changes in septal leads, ST. Trop I negative x1. D-Dimer 2.05. Well score of 4.5 (clinical suspicion, HR). TIMI score 6 (chest pain, HR, SBP). No oxygen requirement 99% RA and afebrile. Pt now complains of headache that extends from the back of her head to the front of her head. Similar to headaches she has had prior. Nursing attempted to ambulate pt and patient became dizzy. She has needed multiple saline bolus to maintain blood pressure and remains mildly tachycardic.   BP 99/55  Pulse 92  Temp(Src) 99.7 F (37.6 C) (Oral)  Resp 18  Ht 5\' 6"  (1.676 m)  Wt 181 lb 9 oz (82.356 kg)  BMI 29.32 kg/m2  SpO2 99%  LMP 05/23/2013 Gen: Pleasant. Appears in pain, and mildly short of breath. Patient hard to read. CV: Tachycardic, regular rhythm. No murmur appreciated.  Lungs: CTAB, no wheeze or rhonchi Ext: no erythema, edema or tenderness. Homans negative. Neuro: Alert, oriented x4. CN 2-12 intact. No focal deficits. MS 5/5 bilateral LE. 4/5 bilateral UE (uncertain if poor effort)  A/P: - d-dimer >2; CTA with no acute PE. Reactive nodes axillary and supraclavicular.  - Pt given one dose of xarelto for what was thought to be probable PE. Discontinued and change back to DVT proph dose of atrixa.  - BP remains soft and pt remains tachycardiac with ongoing chest pain and mild shortness of breath. Transfer to SDU for closer monitoring.  - Repeat EKG and continue to trend trop - Blood cultures pending  - NS IV bolus for soft pressures. Morphine for pain with good response.  - Consult CARDS  Ct Angio Chest Pe W/cm &/or Wo Cm  06/08/2013   CLINICAL DATA:  Chest pain upon inspiration. Low blood pressure. Shortness of breath. Elevated D-dimer.  EXAM: CT ANGIOGRAPHY CHEST WITH CONTRAST  TECHNIQUE:  Multidetector CT imaging of the chest was performed using the standard protocol during bolus administration of intravenous contrast. Multiplanar CT image reconstructions and MIPs were obtained to evaluate the vascular anatomy.  CONTRAST:  13mL OMNIPAQUE IOHEXOL 350 MG/ML SOLN  COMPARISON:  None.  FINDINGS: Technically adequate study with good opacification of the central and segmental pulmonary arteries. No focal filling defects demonstrated. No evidence of significant pulmonary embolus.  Normal heart size. Thoracic aorta and great vessel origins are unremarkable. Esophagus is decompressed. No significant hilar or mediastinal lymphadenopathy. There are prominent lymph nodes in the left supraclavicular and bilateral axillary regions. These are at upper limits of normal and mostly fatty hila are demonstrated. Changes likely represent reactive nodes. Visualized portions of the upper abdominal organs demonstrate probable fatty infiltration of the liver. No significant effusion. Mild dependent atelectasis in the lung bases. No focal airspace disease or consolidation. No pneumothorax. No destructive bone lesions are appreciated.  Review of the MIP images confirms the above findings.  IMPRESSION: No evidence of significant pulmonary embolus. Prominent axillary and supraclavicular lymph nodes are probably reactive.   Electronically Signed   By: Lucienne Capers M.D.   On: 06/08/2013 23:46   Dg Chest Portable 1 View  06/08/2013   CLINICAL DATA:  Rash and generalized body aches.  EXAM: PORTABLE CHEST - 1 VIEW  COMPARISON:  None.  FINDINGS: The cardiac silhouette, mediastinal and hilar contours are within normal limits. Streaky bibasilar atelectasis but  no infiltrates, edema or effusions. The bony thorax is intact.  IMPRESSION: Streaky bibasilar atelectasis but no infiltrates or edema.   Electronically Signed   By: Kalman Jewels M.D.   On: 06/08/2013 02:57

## 2013-06-09 NOTE — Progress Notes (Signed)
I have seen and examined my patient Sandra Hahn Greatly appreciate the assistance of the inpatient service Please make note of bactrim allergy in her chart so to avoid in the future Would recommend inpatient iron studies here as SOB possibly related to sx anemia? Given all neg testing to date Happy to see pt once leaves hospital  Bernadene Bell, MD Family Medicine PGY-1 Please page or call with questions

## 2013-06-09 NOTE — Progress Notes (Signed)
Family Medicine Teaching Service Daily Progress Note Intern Pager: [phone]  Patient name: Sandra Hahn Medical record number: [id number] Date of birth: 08-17-1975 Age: 38 y.o. Gender: female  Primary Care Provider: Langston Masker, MD Consultants: None Code Status: Full  Pt Overview and Major Events to Date:   Assessment and Plan: Sandra Hahn is a 38 y.o. female originally from [place] who presents with fever, rash, and diffuse myalgias. Initially presented with concern for measles, however since determined unlikely Measles. PMH is significant for abnormal TSH, Hx of cerebellar tumor s/p resection, and TIA.  # Fever, Maculopapular Rash, Myalgias, suspected viral URI + reaction to Bactrim - Improving - Initial presentation with concern for measles, since determined patient does not have measles (exam findings not consistent, reported up to date on vaccinations) suspected constellation of symptoms from viral URI with combination of suspected drug reaction to Bactrim (for otitis media). Lactic acid 1.1 - Currently with stable vitals  - Conservative management, Tylenol, IVF - Pain control Oxy 5mg  q 6 PRN - Consult ID: suspicion measles - greatly appreciate recommendations / management      - Not consistent with measles, progressive symptoms uncharacteristic, no evidence of Kop[place] spots, epidemiology does not suggestive increased risk, suspected UTD immunizations      - Highly likely reaction to Bactrim, continue to HOLD observe off      - HIV, RPR, Hep panel- All non-reactive / negative      - Rubeola IgM (pending)  # AKI - Baseline nml creatinine. Likely secondary to poor PO intake/volume depletion in the setting of acute illness - Improving Cr 1.54>>>1.23 - Additional 1 L NS bolus followed by NS @ 125 mL/hr  - Daily BMP  # Chest pain, atypical / pleuritic - Persistent atypical chest pain, ruled out MI (Trop neg x 3), EKG unchanged - Continue to monitor, highly unlikely cardiac  etiology. Overnight with acute event with persistent CP, elevated D-dimer (>2), CTA performed (negative for PE), received Xarelto x 1 dose, since transitioned back to VTE prophylaxis with Atrixa (given culture avoidance of Pork-related products)  # Respiratory Acidosis - Decreased bicarb to 16, likely secondary to tachypnea 29-30, since improved. Suspect 2/2 to AKI as well - Continue to monitor  # Acute on chronic anemia - Baseline Hgb 10-11 - Hgb decreased to 8.4 (previously 10.3 on admission) - No overt / active bleeding - Ordered FOBT - Consider Iron studies (inpatient vs outpatient)  # Low Total Protein - Ordered UA to eval if proteinuria  FEN/GI: Fluids as above. Regular diet.  Prophylaxis: Heparin SQ  Disposition: Pending clinical improvement. Anticipate to transfer out of SDU today.  Subjective:  Resting in bed. Persistent sharp central chest pain and headache. Otherwise generalized symptoms improved today, denies fever/chills. Tolerating PO.  Objective: Temp:  [97.4 F (36.3 C)-99.8 F (37.7 C)] 97.7 F (36.5 C) (06/08 0800) Pulse Rate:  [74-102] 74 (06/08 0800) Resp:  [18-29] 29 (06/08 0800) BP: (90-118)/(53-70) 118/67 mmHg (06/08 0800) SpO2:  [98 %-100 %] 98 % (06/08 0800) Weight:  [181 lb 9 oz (82.356 kg)] 181 lb 9 oz (82.356 kg) (06/07 2047) Physical Exam: General: laying in bed, comfortable, cooperative, NAD Cardiovascular: RRR, no murmurs heard Chest - Non-tender to palpation Respiratory: CTAB, no wheezing, crackles. Nml effort, somewhat limited by pain Abdomen: soft, NTND, +active BS Extremities: moves all ext, non-tender, no edema  Laboratory:  Recent Labs Lab 06/08/13 0249 06/08/13 0841 06/09/13 0242  WBC 7.6 6.8 5.2  HGB 10.3* 9.3* 8.4*  HCT 32.3* 29.8* 26.7*  PLT 208 180 171    Recent Labs Lab 06/08/13 0249 06/08/13 0841 06/09/13 0242  NA 138  --  137  K 4.8  --  4.2  CL 104  --  108  CO2 18*  --  16*  BUN 26*  --  18  CREATININE  1.47* 1.54* 1.23*  CALCIUM 8.9  --  7.6*  PROT  --   --  5.9*  BILITOT  --   --  <0.2*  ALKPHOS  --   --  60  ALT  --   --  20  AST  --   --  22  GLUCOSE 102*  --  125*    Imaging/Diagnostic Tests:  6/7 Chest Xray Portable 1v IMPRESSION:  Streaky bibasilar atelectasis but no infiltrates or edema.  6/7 Chest CTA IMPRESSION:  No evidence of significant pulmonary embolus. Prominent axillary and  supraclavicular lymph nodes are probably reactive.  Nobie Putnam, DO 06/09/2013, 8:30 AM PGY-1, [place] Intern pager: [phone], text pages welcome

## 2013-06-09 NOTE — Progress Notes (Addendum)
[name] for Infectious Disease    Date of Admission:  06/08/2013   Total days of antibiotics 0          ID: Sandra Hahn is a 38 y.o. female with  Recent treatment of uri with bactrim presents with rash, chest pain  Active Problems:   Drug rash    Subjective: Afebrile, doing well. Received IVF, ruled out pe with CTPA  Medications:  . antiseptic oral rinse  15 mL Mouth Rinse BID  . diphenhydrAMINE  12.5 mg Intravenous 3 times per day  . [START ON 06/10/2013] fondaparinux (ARIXTRA) injection  2.5 mg Subcutaneous Q0600  . ketorolac  30 mg Intravenous Once    Objective: Vital signs in last 24 hours: Temp:  [97.7 F (36.5 C)-99.8 F (37.7 C)] 97.9 F (36.6 C) (06/08 1100) Pulse Rate:  [74-101] 88 (06/08 1100) Resp:  [18-29] 22 (06/08 1100) BP: (90-118)/(53-70) 114/70 mmHg (06/08 1100) SpO2:  [97 %-100 %] 97 % (06/08 1100) Weight:  [181 lb 9 oz (82.356 kg)] 181 lb 9 oz (82.356 kg) (06/07 2047) Physical Exam  Constitutional:  oriented to person, place, and time. appears well-developed and well-nourished. No distress.  HENT:  Mouth/Throat: Oropharynx is clear and moist. No oropharyngeal exudate.  Skin: Skin is warm and dry. No rash noted. No erythema.  Psychiatric: a normal mood and affect.  behavior is normal.   Lab Results  Recent Labs  06/08/13 0249 06/08/13 0841 06/09/13 0242  WBC 7.6 6.8 5.2  HGB 10.3* 9.3* 8.4*  HCT 32.3* 29.8* 26.7*  NA 138  --  137  K 4.8  --  4.2  CL 104  --  108  CO2 18*  --  16*  BUN 26*  --  18  CREATININE 1.47* 1.54* 1.23*   Liver Panel  Recent Labs  06/09/13 0242  PROT 5.9*  ALBUMIN 2.4*  AST 22  ALT 20  ALKPHOS 60  BILITOT <0.2*   Sedimentation Rate  Recent Labs  06/08/13 0249  ESRSEDRATE 83*   C-Reactive Protein No results found for this basename: CRP,  in the last 72 hours  Microbiology: 6/7 blood cx ngtd 6/7 rvp pending Studies/Results: Ct Angio Chest Pe W/cm &/or Wo Cm  06/08/2013   CLINICAL DATA:   Chest pain upon inspiration. Low blood pressure. Shortness of breath. Elevated D-dimer.  EXAM: CT ANGIOGRAPHY CHEST WITH CONTRAST  TECHNIQUE: Multidetector CT imaging of the chest was performed using the standard protocol during bolus administration of intravenous contrast. Multiplanar CT image reconstructions and MIPs were obtained to evaluate the vascular anatomy.  CONTRAST:  47m OMNIPAQUE IOHEXOL 350 MG/ML SOLN  COMPARISON:  None.  FINDINGS: Technically adequate study with good opacification of the central and segmental pulmonary arteries. No focal filling defects demonstrated. No evidence of significant pulmonary embolus.  Normal heart size. Thoracic aorta and great vessel origins are unremarkable. Esophagus is decompressed. No significant hilar or mediastinal lymphadenopathy. There are prominent lymph nodes in the left supraclavicular and bilateral axillary regions. These are at upper limits of normal and mostly fatty hila are demonstrated. Changes likely represent reactive nodes. Visualized portions of the upper abdominal organs demonstrate probable fatty infiltration of the liver. No significant effusion. Mild dependent atelectasis in the lung bases. No focal airspace disease or consolidation. No pneumothorax. No destructive bone lesions are appreciated.  Review of the MIP images confirms the above findings.  IMPRESSION: No evidence of significant pulmonary embolus. Prominent axillary and supraclavicular lymph nodes are probably reactive.  Electronically Signed   By: Lucienne Capers M.D.   On: 06/08/2013 23:46   Dg Chest Portable 1 View  06/08/2013   CLINICAL DATA:  Rash and generalized body aches.  EXAM: PORTABLE CHEST - 1 VIEW  COMPARISON:  None.  FINDINGS: The cardiac silhouette, mediastinal and hilar contours are within normal limits. Streaky bibasilar atelectasis but no infiltrates, edema or effusions. The bony thorax is intact.  IMPRESSION: Streaky bibasilar atelectasis but no infiltrates or edema.    Electronically Signed   By: Kalman Jewels M.D.   On: 06/08/2013 02:57     Assessment/Plan: Rash = resolving likely due to sulfa moiety from bactrim. Recommend to list as allergy. Clinical picture not consistent with measles. Measles ab is being checked. If negative, recommend that she gets MMR booster.  Clinical picture likely due to viral infection, continuing with supportive care  Will sign off. Call if questions  [facility] Cell: [phone] Pager: [phone]  06/09/2013, 3:35 PM

## 2013-06-09 NOTE — Progress Notes (Addendum)
Pt c/o headache, chest pain that became worse when taking deep breaths. MD and rapid response notified. RN at bedside.  V/S within normal limits. Report given to 2 Heart for transfer for further monitoring.

## 2013-06-10 ENCOUNTER — Encounter: Payer: Self-pay | Admitting: Family Medicine

## 2013-06-10 DIAGNOSIS — L270 Generalized skin eruption due to drugs and medicaments taken internally: Principal | ICD-10-CM

## 2013-06-10 LAB — BASIC METABOLIC PANEL
BUN: 14 mg/dL (ref 6–23)
CALCIUM: 8 mg/dL — AB (ref 8.4–10.5)
CO2: 13 mEq/L — ABNORMAL LOW (ref 19–32)
Chloride: 103 mEq/L (ref 96–112)
Creatinine, Ser: 1 mg/dL (ref 0.50–1.10)
GFR calc Af Amer: 82 mL/min — ABNORMAL LOW (ref >90)
GFR, EST NON AFRICAN AMERICAN: 71 mL/min — AB (ref >90)
GLUCOSE: 105 mg/dL — AB (ref 70–99)
POTASSIUM: 4.5 meq/L (ref 3.7–5.3)
Sodium: 135 mEq/L — ABNORMAL LOW (ref 137–147)

## 2013-06-10 LAB — BLOOD GAS, ARTERIAL
Acid-base deficit: 9.1 mmol/L — ABNORMAL HIGH (ref 0.0–2.0)
BICARBONATE: 14.5 meq/L — AB (ref 20.0–24.0)
Drawn by: 24486
O2 Content: 2 L/min
O2 SAT: 94.1 %
PO2 ART: 70.9 mmHg — AB (ref 80.0–100.0)
Patient temperature: 98.6
TCO2: 15.2 mmol/L (ref 0–100)
pCO2 arterial: 22.9 mmHg — ABNORMAL LOW (ref 35.0–45.0)
pH, Arterial: 7.418 (ref 7.350–7.450)

## 2013-06-10 LAB — IRON AND TIBC
Iron: <10 ug/dL — ABNORMAL LOW (ref 42–135)
UIBC: 204 ug/dL (ref 125–400)

## 2013-06-10 LAB — TROPONIN I: Troponin I: <0.3 ng/mL (ref <0.30)

## 2013-06-10 LAB — FOLATE: FOLATE: 17.6 ng/mL

## 2013-06-10 LAB — CBC
HEMATOCRIT: 28.8 % — AB (ref 36.0–46.0)
HEMOGLOBIN: 9.1 g/dL — AB (ref 12.0–15.0)
MCH: 26.2 pg (ref 26.0–34.0)
MCHC: 31.6 g/dL (ref 30.0–36.0)
MCV: 83 fL (ref 78.0–100.0)
Platelets: 194 10*3/uL (ref 150–400)
RBC: 3.47 MIL/uL — ABNORMAL LOW (ref 3.87–5.11)
RDW: 14.9 % (ref 11.5–15.5)
WBC: 9.6 10*3/uL (ref 4.0–10.5)

## 2013-06-10 LAB — RUBEOLA ANTIBODY, IGM: Rubeola Antibodies, IgM: <1:20 {titer}

## 2013-06-10 LAB — FERRITIN: Ferritin: 162 ng/mL (ref 10–291)

## 2013-06-10 LAB — VITAMIN B12: VITAMIN B 12: 334 pg/mL (ref 211–911)

## 2013-06-10 MED ORDER — FERROUS SULFATE 325 (65 FE) MG PO TBEC: 325.0000 mg | DELAYED_RELEASE_TABLET | Freq: Every day | ORAL | Status: DC

## 2013-06-10 MED ORDER — SALINE 0.9 % NA AERS: 1.0000 | INHALATION_SPRAY | Freq: Two times a day (BID) | NASAL | Status: DC | PRN

## 2013-06-10 MED ORDER — SODIUM BICARBONATE 650 MG PO TABS
1300.0000 mg | ORAL_TABLET | Freq: Two times a day (BID) | ORAL | Status: DC
  Administered 2013-06-10: 1300 mg via ORAL
  Filled 2013-06-10 (×2): qty 2

## 2013-06-10 MED ORDER — DIPHENHYDRAMINE HCL 25 MG PO CAPS: 25.0000 mg | ORAL_CAPSULE | Freq: Four times a day (QID) | ORAL | Status: DC | PRN

## 2013-06-10 NOTE — Progress Notes (Signed)
Pt ekg reads NSR. Page to MD for notification it has been obtained and uploaded to [facility]. Jovi Alvizo Candelaria Stagers, RN

## 2013-06-10 NOTE — Progress Notes (Signed)
Pt with complaints of chest pain/pressure. Obtain ekg, admin 1 mg morphine per order, page to MD for notification. V/s 105/67, 98.6, 108, 18, 92% on RA. Awaiting call back. Raya Mckinstry Candelaria Stagers, RN

## 2013-06-10 NOTE — Progress Notes (Signed)
Family Medicine Teaching Service Daily Progress Note Intern Pager: [phone]  Patient name: Sandra Hahn Medical record number: [id number] Date of birth: 08-01-1975 Age: 38 y.o. Gender: female  Primary Care Provider: Langston Masker, MD Consultants: None Code Status: Full  Pt Overview and Major Events to Date:   6/7 - Admitted, initial suspicion for measles, c/s ID (NOT consistent with measles), elevated D-dimer, neg CTA 6/8 - Trop neg x 3 (pleuritic CP but improved generalized symptoms), LA 1.1, ID signed off 6/9 - EKG (unremarkable), Bicarb 16>>13, Hgb 8.4>>>9.1, overall improved CP, no O2 req, plan DC today  Assessment and Plan: Sandra Hahn is a 38 y.o. female originally from [place] who presents with fever, rash, and diffuse myalgias. Initially presented with concern for measles, however since determined unlikely Measles. PMH is significant for abnormal TSH, Hx of cerebellar tumor s/p resection, and TIA.  # Fever, Maculopapular Rash, Myalgias, suspected viral URI + reaction to Bactrim - Resolved - Initial presentation with concern for measles, since determined patient does not have measles (exam findings not consistent, reported up to date on vaccinations) suspected constellation of symptoms from viral URI with combination of suspected drug reaction to Bactrim (for otitis media). Lactic acid 1.1 - Currently with stable vitals, persistent headache (improved with rest and Tylenol) - Conservative management, Tylenol, IVF - Pain control Oxy 5mg  q 6 PRN - Consult ID: suspicion measles - greatly appreciate recommendations / management      - Not consistent with measles, progressive symptoms uncharacteristic, no evidence of Koplik spots, epidemiology does not suggestive increased risk, suspected UTD immunizations      - Highly likely reaction to Bactrim, continue to HOLD observe off - Added to allergy list      - HIV, RPR, Hep panel- All non-reactive / negative      - Rubeola IgM (pending)       - ID signed off 06/09/13  # AKI - Improving - Baseline nml creatinine. Likely secondary to poor PO intake/volume depletion in the setting of acute illness - Improving Cr 1.54>1.23>>>1.00 - s/p IVF rehydration - Continue good PO intake  # Chest pain, atypical / pleuritic - Improved - Persistent atypical chest pain, ruled out MI (Trop neg x 3), EKG unchanged - Continue to monitor, highly unlikely cardiac etiology. Overnight with acute event with persistent CP, elevated D-dimer (>2), CTA performed (negative for PE), received Xarelto x 1 dose, since transitioned back to VTE prophylaxis with Atrixa (given culture avoidance of Pork-related products) - o/n 6/9 repeat episode CP, EKG stable, improved with Morphine IV - Currently asymptomatic with mostly improved CP  # Respiratory alkalosis with secondary metabolic acidosis (compensation)  - initially considered 2/2 to tachypnea 29-30, since resolved RR, persistent lab abnormalities, anticipate to improve - Continued decreased bicarb 16>>13, now with AG 19 (yesterday normal gap 13) - ABG:  PH 7.418, pO2 70.9; pCO2 22.9; HCO3 14.5, %O2 Sat 94.1 - consistent with primary respiratory alkalosis, since pH currently completely normal, evidence of complete metabolic compensation. - s/p Bicarb 1300mg  x 2 doses today - Recommend follow-up BMET in clinic  # Acute on chronic anemia, suspected secondary to iron deficiency - Improving - Baseline Hgb 10-11, 10.3 on admission - Hgb improved 8.4 >> 9.1 - No overt / active bleeding - Ordered FOBT - Iron studies - low iron (< 10), ferritin (162), Vitamin B12 (334), Folate (17.6) - Prescribe Iron supplement 325mg  PO daily on discharge, may titrate up as needed outpatient  # Protein Calorie Malnutrition, suspected -  Low total protein - UA with only 30 protein, otherwise negative  FEN/GI: Fluids as above. Regular diet.  Prophylaxis: Heparin SQ  Disposition: Clinical improved, plan to discharge to home  today.  Subjective: Overall feels better. Reduced chest pain, only c/o of persistent headache (improves with Tylenol). Tolerating PO, ambulation, no supplemental O2 requirement). Denies fever/chills, worsening rash.  Objective: Temp:  [97.6 F (36.4 C)-99.1 F (37.3 C)] 98.9 F (37.2 C) (06/09 0916) Pulse Rate:  [81-108] 97 (06/09 0916) Resp:  [18-21] 19 (06/09 0916) BP: (105-127)/(67-81) 115/73 mmHg (06/09 0916) SpO2:  [92 %-100 %] 94 % (06/09 0916) Weight:  [182 lb 1.6 oz (82.6 kg)] 182 lb 1.6 oz (82.6 kg) (06/08 2114) Physical Exam: General: laying in bed, comfortable, cooperative, NAD Cardiovascular: RRR, no murmurs heard Chest - Non-tender to palpation Respiratory: CTAB, no wheezing, crackles. Nml effort, somewhat limited by pain Abdomen: soft, NTND, +active BS Extremities: moves all ext, non-tender, improved b/l hand / feet non-pitting edema  Laboratory:  Recent Labs Lab 06/08/13 0841 06/09/13 0242 06/10/13 0438  WBC 6.8 5.2 9.6  HGB 9.3* 8.4* 9.1*  HCT 29.8* 26.7* 28.8*  PLT 180 171 194    Recent Labs Lab 06/08/13 0249 06/08/13 0841 06/09/13 0242 06/10/13 0438  NA 138  --  137 135*  K 4.8  --  4.2 4.5  CL 104  --  108 103  CO2 18*  --  16* 13*  BUN 26*  --  18 14  CREATININE 1.47* 1.54* 1.23* 1.00  CALCIUM 8.9  --  7.6* 8.0*  PROT  --   --  5.9*  --   BILITOT  --   --  <0.2*  --   ALKPHOS  --   --  60  --   ALT  --   --  20  --   AST  --   --  22  --   GLUCOSE 102*  --  125* 105*    Imaging/Diagnostic Tests:  6/7 Chest Xray Portable 1v IMPRESSION:  Streaky bibasilar atelectasis but no infiltrates or edema.  6/7 Chest CTA IMPRESSION:  No evidence of significant pulmonary embolus. Prominent axillary and  supraclavicular lymph nodes are probably reactive.  Nobie Putnam, DO 06/10/2013, 1:39 PM PGY-1, [place] Intern pager: [phone], text pages welcome

## 2013-06-10 NOTE — Progress Notes (Signed)
Paged about chest pain. Patient received morphine which helped pain. Went up to see patient. EKG was NSR. Telemetry showed short period of tachycardia to 140 with no other events. When arrived to patient's room, patient was sleeping comfortably. Vital signs wnl. Will continue to monitor patient's symptoms overnight  Cordelia Poche, MD PGY-1, [facility] 06/10/2013, 2:00 AM

## 2013-06-10 NOTE — Progress Notes (Signed)
Seen and examined.  Discussed with Dr. Raliegh Ip.  Agree with his management and documentation. I believe it is safe to DC today with close outpatient follow up.

## 2013-06-10 NOTE — Progress Notes (Signed)
Return call from Dr. Lonny Prude for notification of pt status.  Pt still with complaints of chest pain 8/10. O2 sats down to 84% on RA. Initiated on 2 l/m via n/c. Sats return to 92%. Will re-page MD when EKG complete. Nahomi Hegner Candelaria Stagers, RN

## 2013-06-10 NOTE — Discharge Summary (Signed)
[facility] Discharge Summary  Patient name: Sandra Hahn Medical record number: [id number] Date of birth: 1975/04/15 Age: 38 y.o. Gender: female Date of Admission: 06/08/2013  Date of Discharge: 06/10/13 Admitting Physician: Minerva Ends, MD  Primary Care Provider: Langston Masker, MD Consultants: Infectious Disease  Indication for Hospitalization: Rash, myalgias, fever  Discharge Diagnoses/Problem List:  Fever, Maculopapular Rash, Myalgias, suspected viral URI + reaction to Bactrim - Resolved AKI - Improving Chest pain, atypical pleuritic - Resolved Respiratory alkalosis with secondary metabolic acidosis (compensated) - Improved Acute on chronic anemia, suspected secondary to iron deficiency Presumed protein calorie malnutrition  Disposition: Home  Discharge Condition: Stable  Discharge Exam: General: laying in bed, comfortable, cooperative, NAD  Cardiovascular: RRR, no murmurs heard  Chest - Non-tender to palpation  Respiratory: CTAB, no wheezing, crackles. Nml effort, somewhat limited by pain  Abdomen: soft, NTND, +active BS  Extremities: moves all ext, non-tender, improved b/l hand / feet non-pitting edema  Brief Hospital Course:  Sandra Hahn is a 38 y.o. female originally from [place] who presented with fever (low-grade 100.60F), rash (fine diffuse slightly raised papular on face, arms, legs), and diffuse myalgias. Initially presented with concern for measles, however since determined unlikely Measles. PMH is significant for abnormal TSH, Hx of cerebellar tumor s/p resection, and TIA. Initial concern in ED was for measles requiring admission and ID consult, however given history and risk factors, strong suspicion that rash and symptoms due to drug reaction from recent Bactrim course (for otitis). Admitted under droplet precautions, anti-pyretics, IVF, pain control, monitoring. Infectious Disease consulted and essentially ruled out Measles,  presentation/exam not consistent (no Koplik's spots), reported UTD vaccines, no other risk factors, highly likely Bactrim reaction, additionally Rubeola IgM (negative), and extensive ID panel also Negative (HIV, Hep, RPR). Additionally, found to have AKI (elevated Cr to 1.47), elevated D-dimer (2.05), ESR 85, Lactic Acid (1.1), CXR (negative), d/t elevated D-dimer had Chest CTA (negative for PE, noted prominent likely reactive axillary / supraclavicular nodes).  During hospitalization, the patient's presenting symptoms continued to improve within 24 hours of admission, however patient experienced increased atypical pleuritic chest pain and headache. Further work-up for CP with EKG (unchanged), and Troponin (neg x 3), ruled out cardiac etiology. Continued monitoring with improvement, no O2 requirement, resolving AKI with improving Creatinine. Labs revealed concern with decreasing bicarb 18>>13 (nml at baseline), review of ABG to quantify with normal pH low CO2, consistent with primary respiratory alkalosis (secondary to tachypnea on initial presentation), with compensated metabolic acidosis, received PO bicarb x 2 doses. Additionally, found to have anemia with Hgb 8-10 (b/l 11-12), anemia panel consistent with iron deficiency anemia. At time of discharge, patient with stable vitals, resolved CP, mild persistent HA, tolerating PO and ambulation, arranged close follow-up.  Issues for Follow Up:  1. Resolution of symptoms (HA, atypical CP, rash)  2. Repeat BMET - F/u bicarb (13 on DC), Cr (1.00 on DC)  3. Iron deficiency anemia - Discharged on iron PO 322m daily x 1 month, may titrate up as tolerated, monitor CBC  4. Chest CTA - Noted prominent lymph nodes in left supraclavicular, bilateral axillary regions, suspected reactive. Note on exam prior to discharge, no supraclavicular or axillary nodes significantly palpable. Recommend, repeat exam and monitoring  5. H/o epistaxis / allergies - Prescribed nasal  saline spray, also h/o prior anti-histamine course (none currently), advised pt may pick up loratadine or zyrtec OTC.  Significant Procedures: none  Significant Labs and Imaging:   Recent Labs  Lab 06/08/13 0841 06/09/13 0242 06/10/13 0438  WBC 6.8 5.2 9.6  HGB 9.3* 8.4* 9.1*  HCT 29.8* 26.7* 28.8*  PLT 180 171 194    Recent Labs Lab 06/08/13 0249 06/08/13 0841 06/09/13 0242 06/10/13 0438  NA 138  --  137 135*  K 4.8  --  4.2 4.5  CL 104  --  108 103  CO2 18*  --  16* 13*  GLUCOSE 102*  --  125* 105*  BUN 26*  --  18 14  CREATININE 1.47* 1.54* 1.23* 1.00  CALCIUM 8.9  --  7.6* 8.0*  ALKPHOS  --   --  60  --   AST  --   --  22  --   ALT  --   --  20  --   ALBUMIN  --   --  2.4*  --    Rubeola IgM - Not detected, < 1:20  Respiratory Viral Panel - Negative Blood Cultures (collected 06/08/13 - NGTD currently)  Upreg - negative TSH - 14.090, Free T4 - 1.05, Free T3 - 2.6 ESR - 83 HIV - non-reactive, RPR - Non-reactive, Hepatitis Panel - negative Troponin-I (negative x 3) D-dimer - 2.05 Lactic Acid - 1.1  Anemia Panel - low iron (< 10), ferritin (162), Vitamin B12 (334), Folate (17.6)    ABG: PH 7.418, pO2 70.9; pCO2 22.9; HCO3 14.5, %O2 Sat 94.1   6/7 Chest Xray Portable 1v  IMPRESSION:  Streaky bibasilar atelectasis but no infiltrates or edema.  6/7 Chest CTA  IMPRESSION:  No evidence of significant pulmonary embolus. Prominent axillary and  supraclavicular lymph nodes are probably reactive.  Results/Tests Pending at Time of Discharge: none  1. Blood Cultures (collected 06/08/13 - NGTD currently)  Discharge Medications:    Medication List    STOP taking these medications       sulfamethoxazole-trimethoprim 800-160 MG per tablet  Commonly known as:  BACTRIM DS      TAKE these medications       diphenhydrAMINE 25 mg capsule  Commonly known as:  BENADRYL  Take 1 capsule (25 mg total) by mouth every 6 (six) hours as needed.     ferrous sulfate 325  (65 FE) MG EC tablet  Take 1 tablet (325 mg total) by mouth daily with breakfast.     neomycin-polymyxin-hydrocortisone otic solution  Commonly known as:  CORTISPORIN  Place 3 drops into the left ear 4 (four) times daily.     Saline 0.9 % Aers  Place 1 spray into the nose 2 (two) times daily as needed.        Discharge Instructions: Please refer to Patient Instructions section of EMR for full details.  Patient was counseled important signs and symptoms that should prompt return to medical care, changes in medications, dietary instructions, activity restrictions, and follow up appointments.   Follow-Up Appointments: Follow-up Information   Follow up with Langston Masker, MD On 06/16/2013. (at 10:30am for hospital follow-up)    Specialty:  [facility]   Contact information:   [place] [address] [phone]       Nobie Putnam, DO 06/10/2013, 7:52 PM PGY-1, [place]

## 2013-06-10 NOTE — Progress Notes (Signed)
MD, Dr. Lonny Prude, to bedside for evaluation. Patient resting comfortably. Sandra Colt Candelaria Stagers, RN

## 2013-06-10 NOTE — Discharge Instructions (Signed)
You were hospitalized for a constellation of viral symptoms, rash, muscle pain, chest pain, and headache. Our testing has shown that you do NOT have Measles, and we believe that you experienced an adverse reaction to the Bactrim (Sulfa drug) antibiotic. Otherwise, the rest of your symptoms were likely caused by a bad respiratory virus. Your heart has tested out to be normal (no signs of any heart damage). - We did find that your blood is iron deficient, and you have a low blood count - Prescribed Iron supplement tablets (please take 1 tab daily with breakfast for the next 1 month) - discuss this more in detail with your regular doctor in the clinic - For any further pain or headache, we recommend continuing to take the Tylenol as needed, You may try Ibuprofen 200-400mg  every 6 hours (make sure you take with food) if your pain persists - For your dry nose and sinus problems, we recommend using a daily nose spray (sent a prescription in, otherwise you can pick one up over the counter), also recommend trial of an allergy medication (these are over the counter as well)  If you develop any worsening chest pain, rash or other concerns, please try to call our clinic and schedule follow-up appointment sooner. Otherwise, if it is urgent or after hours, please return to the Emergency Department for evaluation.

## 2013-06-11 NOTE — Discharge Summary (Signed)
Seen and examined on the day of DC.  Agree with Dr. Marthann Schiller documentation and management.

## 2013-06-14 LAB — CULTURE, BLOOD (ROUTINE X 2)
CULTURE: NO GROWTH
Culture: NO GROWTH

## 2013-06-16 ENCOUNTER — Ambulatory Visit (INDEPENDENT_AMBULATORY_CARE_PROVIDER_SITE_OTHER): Payer: No Typology Code available for payment source | Admitting: Family Medicine

## 2013-06-16 VITALS — BP 97/59 | HR 84 | Temp 98.2°F | Resp 20 | Wt 179.0 lb

## 2013-06-16 DIAGNOSIS — H9190 Unspecified hearing loss, unspecified ear: Secondary | ICD-10-CM

## 2013-06-16 DIAGNOSIS — J309 Allergic rhinitis, unspecified: Secondary | ICD-10-CM

## 2013-06-16 DIAGNOSIS — H9192 Unspecified hearing loss, left ear: Secondary | ICD-10-CM

## 2013-06-16 DIAGNOSIS — R590 Localized enlarged lymph nodes: Secondary | ICD-10-CM

## 2013-06-16 DIAGNOSIS — R599 Enlarged lymph nodes, unspecified: Secondary | ICD-10-CM

## 2013-06-16 DIAGNOSIS — E878 Other disorders of electrolyte and fluid balance, not elsewhere classified: Secondary | ICD-10-CM

## 2013-06-16 LAB — BASIC METABOLIC PANEL
BUN: 22 mg/dL (ref 6–23)
CHLORIDE: 106 meq/L (ref 96–112)
CO2: 22 mEq/L (ref 19–32)
Calcium: 8.8 mg/dL (ref 8.4–10.5)
Creat: 1.04 mg/dL (ref 0.50–1.10)
Glucose, Bld: 75 mg/dL (ref 70–99)
POTASSIUM: 4.4 meq/L (ref 3.5–5.3)
SODIUM: 138 meq/L (ref 135–145)

## 2013-06-16 NOTE — Progress Notes (Signed)
Patient ID: Sandra Hahn, female   DOB: 1975-09-02, 38 y.o.   MRN: [id number]   [facility] Bernadene Bell, MD Phone: [phone]  Subjective:  Ms Marton is a 38 y.o F who is here for hospital f/up  -HA/ atypical chest pain and rash now resolved -pt needs repeat BMET - bicarb (13 on DC), Cr (1.00 on DC)  -noted to have Iron deficiency anemia - Discharged on iron PO 325mg  daily x 1 month, currently taking, appears to be tolerating well -had Chest CTA to r/o PE- Noted prominent lymph nodes in left supraclavicular, bilateral axillary regions, suspected reactive, will repeat in 3 months time -H/o epistaxis / allergies - Prescribed nasal saline spray,  advised pt may pick up loratadine or zyrtec OTC in hospital, currently taking with improvement of sx -still having ear wax build up and hearing issues and discomfort   All systems were reviewed and were negative unless otherwise noted in the HPI  Past Medical History Patient Active Problem List   Diagnosis Date Noted  . Drug rash 06/09/2013  . Hip pain, left 05/07/2013  . Abdominal pain, epigastric 07/08/2012  . Abnormal TSH 07/08/2012  . Unspecified hypothyroidism 06/21/2012  . TIA (transient ischemic attack) 06/19/2012  . Gonadal dysgenesis 08/17/2011  . Female infertility associated with anovulation 08/08/2011  . Verruca vulgaris 08/08/2011  . Allergic rhinitis 08/08/2011  . Well woman exam with routine gynecological exam 08/08/2011  . Lumbar disc herniation 07/05/2011  . Chronic tension headaches 07/05/2011  . Cerebellar tumor 02/14/2011   Reviewed problem list.  Medications- reviewed and updated Chief complaint-noted No additions to family history Social history- patient is a never smoker  Objective: BP 97/59  Pulse 84  Temp(Src) 98.2 F (36.8 C) (Oral)  Resp 20  Wt 179 lb (81.194 kg)  SpO2 100%  LMP 06/11/2013 Gen: NAD, alert, cooperative with exam HEENT: NCAT, EOMI, PERRL, Unable to  visualize left TM 2/2 wax impaction, Right TM clear; no sinus pressure/pain Neck: FROM, supple, no adenopathy Neuro: Alert and oriented, No gross deficits Skin: no rashes no lesions  Assessment/Plan: See problem based a/p

## 2013-06-16 NOTE — Patient Instructions (Signed)
Ms Whitelaw it was great to see you today!  I am pleased to hear that things are going well for you. I am sorry you are still having difficulty with the ear waxing and hearing.  Please take the debrox drops twice daily for the next several days.  You can also use rubbing alcohol one cap in your ear every day for 30 seconds on each side  Please do not use anything in your ear to scrape it out  I have placed a referral for ENT to evaluate for your ear further I will call you if your results of your labs look abnormal, otherwise I will send in the mail Please cont taking the iron pills. We will reassess the CT of chest in about 3 months  Looking forward to seeing you soon Bernadene Bell, MD

## 2013-06-16 NOTE — Assessment & Plan Note (Signed)
Pt instructed to take flonase while in hospital (has been able to pick up) Plans for OTC allergic med Some improvement of allergy sx already noted

## 2013-06-16 NOTE — Assessment & Plan Note (Signed)
A: Seen on CT in hospital when assessed for possible PE  thought to be reactive in teh hospital (2/2 drug rxn and infection) However hx of malignancy concerning for something else No prominent lymphs noted on physical exam today P: plan to repeat CT in 3 months time

## 2013-06-16 NOTE — Assessment & Plan Note (Signed)
Noted in the hospital  13 on d/c, along with cr of 1.0 Repeat BMET today Will reassess if needs referral to renal

## 2013-06-16 NOTE — Assessment & Plan Note (Signed)
A: subacute, likely assc with cerumen impaction However does have hx of cerebellar tumor P; will refer to ENT Cont to use ear drops and rubbing ETOH as needed Possible need for reimaging of head

## 2013-06-17 ENCOUNTER — Encounter: Payer: Self-pay | Admitting: Family Medicine

## 2013-07-11 ENCOUNTER — Encounter: Payer: Self-pay | Admitting: *Deleted

## 2013-10-10 ENCOUNTER — Emergency Department (HOSPITAL_COMMUNITY): Payer: No Typology Code available for payment source

## 2013-10-10 ENCOUNTER — Ambulatory Visit (INDEPENDENT_AMBULATORY_CARE_PROVIDER_SITE_OTHER): Payer: No Typology Code available for payment source | Admitting: Family Medicine

## 2013-10-10 ENCOUNTER — Encounter: Payer: Self-pay | Admitting: Family Medicine

## 2013-10-10 ENCOUNTER — Other Ambulatory Visit: Payer: Self-pay

## 2013-10-10 ENCOUNTER — Encounter (HOSPITAL_COMMUNITY): Payer: Self-pay | Admitting: Emergency Medicine

## 2013-10-10 ENCOUNTER — Emergency Department (HOSPITAL_COMMUNITY)
Admission: EM | Admit: 2013-10-10 | Discharge: 2013-10-10 | Disposition: A | Discharge status: Home / Self Care | Payer: Self-pay | Attending: Emergency Medicine | Admitting: Emergency Medicine

## 2013-10-10 VITALS — BP 119/82 | HR 93 | Temp 98.2°F | Wt 173.0 lb

## 2013-10-10 DIAGNOSIS — Z3202 Encounter for pregnancy test, result negative: Secondary | ICD-10-CM | POA: Insufficient documentation

## 2013-10-10 DIAGNOSIS — R51 Headache: Secondary | ICD-10-CM | POA: Insufficient documentation

## 2013-10-10 DIAGNOSIS — Z86011 Personal history of benign neoplasm of the brain: Secondary | ICD-10-CM | POA: Insufficient documentation

## 2013-10-10 DIAGNOSIS — Z8739 Personal history of other diseases of the musculoskeletal system and connective tissue: Secondary | ICD-10-CM | POA: Insufficient documentation

## 2013-10-10 DIAGNOSIS — R519 Headache, unspecified: Secondary | ICD-10-CM

## 2013-10-10 DIAGNOSIS — Z79899 Other long term (current) drug therapy: Secondary | ICD-10-CM | POA: Insufficient documentation

## 2013-10-10 DIAGNOSIS — G458 Other transient cerebral ischemic attacks and related syndromes: Secondary | ICD-10-CM

## 2013-10-10 LAB — URINE MICROSCOPIC-ADD ON

## 2013-10-10 LAB — PREGNANCY, URINE: Preg Test, Ur: NEGATIVE

## 2013-10-10 LAB — CBC WITH DIFFERENTIAL/PLATELET
Basophils Absolute: 0 10*3/uL (ref 0.0–0.1)
Basophils Relative: 0 % (ref 0–1)
EOS ABS: 0.1 10*3/uL (ref 0.0–0.7)
Eosinophils Relative: 2 % (ref 0–5)
HEMATOCRIT: 38.9 % (ref 36.0–46.0)
Hemoglobin: 12.3 g/dL (ref 12.0–15.0)
Lymphocytes Relative: 38 % (ref 12–46)
Lymphs Abs: 2.7 10*3/uL (ref 0.7–4.0)
MCH: 26.3 pg (ref 26.0–34.0)
MCHC: 31.6 g/dL (ref 30.0–36.0)
MCV: 83.3 fL (ref 78.0–100.0)
MONOS PCT: 7 % (ref 3–12)
Monocytes Absolute: 0.5 10*3/uL (ref 0.1–1.0)
Neutro Abs: 3.8 10*3/uL (ref 1.7–7.7)
Neutrophils Relative %: 53 % (ref 43–77)
Platelets: 325 10*3/uL (ref 150–400)
RBC: 4.67 MIL/uL (ref 3.87–5.11)
RDW: 14.7 % (ref 11.5–15.5)
WBC: 7.2 10*3/uL (ref 4.0–10.5)

## 2013-10-10 LAB — URINALYSIS, ROUTINE W REFLEX MICROSCOPIC
Bilirubin Urine: NEGATIVE
GLUCOSE, UA: NEGATIVE mg/dL
KETONES UR: NEGATIVE mg/dL
Nitrite: NEGATIVE
Protein, ur: 30 mg/dL — AB
Specific Gravity, Urine: 1.023 (ref 1.005–1.030)
Urobilinogen, UA: 1 mg/dL (ref 0.0–1.0)
pH: 6.5 (ref 5.0–8.0)

## 2013-10-10 LAB — PROTIME-INR
INR: 1.04 (ref 0.00–1.49)
Prothrombin Time: 13.6 seconds (ref 11.6–15.2)

## 2013-10-10 LAB — RAPID URINE DRUG SCREEN, HOSP PERFORMED
Amphetamines: NOT DETECTED
BENZODIAZEPINES: NOT DETECTED
Barbiturates: NOT DETECTED
COCAINE: NOT DETECTED
OPIATES: NOT DETECTED
TETRAHYDROCANNABINOL: NOT DETECTED

## 2013-10-10 LAB — COMPREHENSIVE METABOLIC PANEL
ALK PHOS: 77 U/L (ref 39–117)
ALT: 36 U/L — ABNORMAL HIGH (ref 0–35)
ANION GAP: 12 (ref 5–15)
AST: 23 U/L (ref 0–37)
Albumin: 3.9 g/dL (ref 3.5–5.2)
BUN: 13 mg/dL (ref 6–23)
CALCIUM: 9.7 mg/dL (ref 8.4–10.5)
CO2: 23 meq/L (ref 19–32)
Chloride: 103 mEq/L (ref 96–112)
Creatinine, Ser: 0.89 mg/dL (ref 0.50–1.10)
GFR calc Af Amer: >90 mL/min (ref >90)
GFR calc non Af Amer: 81 mL/min — ABNORMAL LOW (ref >90)
GLUCOSE: 85 mg/dL (ref 70–99)
Potassium: 3.7 mEq/L (ref 3.7–5.3)
Sodium: 138 mEq/L (ref 137–147)
TOTAL PROTEIN: 8.4 g/dL — AB (ref 6.0–8.3)
Total Bilirubin: 0.4 mg/dL (ref 0.3–1.2)

## 2013-10-10 LAB — CBG MONITORING, ED: Glucose-Capillary: 66 mg/dL — ABNORMAL LOW (ref 70–99)

## 2013-10-10 MED ORDER — METOCLOPRAMIDE HCL 5 MG/ML IJ SOLN
10.0000 mg | Freq: Once | INTRAMUSCULAR | Status: AC
  Administered 2013-10-10: 10 mg via INTRAVENOUS
  Filled 2013-10-10: qty 2

## 2013-10-10 MED ORDER — OXYCODONE-ACETAMINOPHEN 5-325 MG PO TABS
1.0000 | ORAL_TABLET | Freq: Once | ORAL | Status: AC
  Administered 2013-10-10: 1 via ORAL
  Filled 2013-10-10: qty 1

## 2013-10-10 MED ORDER — OXYCODONE-ACETAMINOPHEN 5-325 MG PO TABS: 1.0000 | ORAL_TABLET | Freq: Four times a day (QID) | ORAL | Status: DC | PRN

## 2013-10-10 NOTE — Progress Notes (Signed)
Patient ID: Sandra Hahn, female   DOB: 04-30-1975, 38 y.o.   MRN: [id number]   [facility] Bernadene Bell, MD Phone: [phone]  Subjective:  Sandra Hahn is a 38 y.o F who is well known to me who presents for SDA for sudden memory loss  # Memory loss  -pt went to bed last night and felt fine -no head trauma, no acute stressors -woke up this morning and suddenly could not remember the names of her niece or herself -was trying to study and could not concentrate -additionally had some right forearm tingling (which she has had in the past) -last CT head in 2014- which was neg for acute infarct and showed some chronic post surgical changes (has hx of cerebellar tumor) -MMSE today: 28/30  All relevant systems were reviewed and were negative unless otherwise noted in the HPI  Past Medical History Reviewed problem list.  Medications- reviewed and updated Current Outpatient Prescriptions  Medication Sig Dispense Refill  . diphenhydrAMINE (BENADRYL) 25 mg capsule Take 1 capsule (25 mg total) by mouth every 6 (six) hours as needed.  30 capsule  0  . ferrous sulfate 325 (65 FE) MG EC tablet Take 1 tablet (325 mg total) by mouth daily with breakfast.  30 tablet  0  . neomycin-polymyxin-hydrocortisone (CORTISPORIN) otic solution Place 3 drops into the left ear 4 (four) times daily.  10 mL  1  . Saline 0.9 % AERS Place 1 spray into the nose 2 (two) times daily as needed.  1 Can  0   No current facility-administered medications for this visit.   Chief complaint-noted No additions to family history Social history- patient is a never smoker  Objective: BP 119/82  Pulse 93  Temp(Src) 98.2 F (36.8 C) (Oral)  Wt 173 lb (78.472 kg)  LMP 10/06/2013 Gen: NAD, alert, cooperative with exam HEENT: NCAT, EOMI, PERRL Neck: FROM, supple Ext: No edema, warm, normal tone, moves UE/LE spontaneously; strength 5/5 throughout upper and lower extremity  Neuro: Alert and oriented,  MMSE 28/30; neg protonator drift; nml CN testing, appears to have some word finding difficulties (fluent in [place])  Skin: no rashes no lesions  Assessment/Plan: See problem based a/p\

## 2013-10-10 NOTE — ED Notes (Signed)
The pt is c/o some rt arm numbness at 10000am    She only has a small amount of residual rt arm numbness .  She also has a headache that was there at 10000am  Worse on the lt.  She came to the [facility] at 1400 and then was sent here now.  She has had 2 other similar episodes in [place] and she was worked up for a stroke.

## 2013-10-10 NOTE — ED Notes (Signed)
MD at bedside. 

## 2013-10-10 NOTE — Consult Note (Signed)
NEURO HOSPITALIST CONSULT NOTE    Reason for Consult: HA  HPI:                                                                                                                                          Sandra Hahn is an 38 y.o. female who states she woke up this AM with a HA that was located over the left parietal region of her head.  IT is throbbing in quality, currently 5/10, has no radiation, associated with no phonophobia or photophobia. She does feel her memory was impaired during her HA this AM stating she could not recall peoples names--currently having no problems. Upon waking this AM she stated her HA was associated with right arm numbness but this numbness has no resolved. While in ED she has received Reglan which has helped reduce her HA. She states she gets a HA "2-3 times a week" and usually takes APAP, Saline nasal spray and /or Loratadine. She has been seen by her PCP in the past for these HA and usually is prescribed Tylenol.    She denies blurred vision, ocular pain, weakness in any extremity.   Currently patient is comfortable, BP 123/81, respirations 20.   Past Medical History  Diagnosis Date  . Cerebellar tumor 2005    s/p surgical resection and radiation in [place]  . Allergy   . Lumbar arthropathy 2011    Left paracentral disc protrusion at L5-S1 with potential contact of the S1 nerve roots bilaterally, more likely on the left    Past Surgical History  Procedure Laterality Date  . Tonsillectomy  1985  . Exploratory laparotomy  2009    Laparoscopy revealed lack of ovaries bilaterally in [place]  . Brain surgery  2005    Cerebellar tumor resection  . Cerebellar tumor  2005    Dx and resected in 2005    Family History  Problem Relation Age of Onset  . Lumbar disc disease Mother   . Hypertension Father   . Diabetes Neg Hx   . Cancer Neg Hx   . Birth defects Neg Hx      Social History:  reports that she has never smoked. She has  never used smokeless tobacco. She reports that she does not drink alcohol or use illicit drugs.  Allergies  Allergen Reactions  . Bactrim [Sulfamethoxazole-Tmp Ds] Rash    Rash to entire body.   . Pork-Derived Products Other (See Comments)    Not a true allergy..Called patient. Advised patient of provider's approval for requested procedure, as well as any comments/instructions from provider.   Provided patient w/ verbal instructions concerning pre-, intra- and post-procedure preparation and instructions.  Patient verbalized understanding of the above.  Not a true allergy.  Patient is a Muslim  and requests no pork derived products.    MEDICATIONS:                                                                                                                     No current facility-administered medications for this encounter.   Current Outpatient Prescriptions  Medication Sig Dispense Refill  . acetaminophen (TYLENOL) 325 MG tablet Take 650 mg by mouth every 6 (six) hours as needed for moderate pain.          ROS:                                                                                                                                       History obtained from the patient  General ROS: negative for - chills, fatigue, fever, night sweats, weight gain or weight loss Psychological ROS: negative for - behavioral disorder, hallucinations, memory difficulties, mood swings or suicidal ideation Ophthalmic ROS: negative for - blurry vision, double vision, eye pain or loss of vision ENT ROS: negative for - epistaxis, nasal discharge, oral lesions, sore throat, tinnitus or vertigo Allergy and Immunology ROS: negative for - hives or itchy/watery eyes Hematological and Lymphatic ROS: negative for - bleeding problems, bruising or swollen lymph nodes Endocrine ROS: negative for - galactorrhea, hair pattern changes, polydipsia/polyuria or temperature intolerance Respiratory ROS: negative for  - cough, hemoptysis, shortness of breath or wheezing Cardiovascular ROS: negative for - chest pain, dyspnea on exertion, edema or irregular heartbeat Gastrointestinal ROS: negative for - abdominal pain, diarrhea, hematemesis, nausea/vomiting or stool incontinence Genito-Urinary ROS: negative for - dysuria, hematuria, incontinence or urinary frequency/urgency Musculoskeletal ROS: negative for - joint swelling or muscular weakness Neurological ROS: as noted in HPI Dermatological ROS: negative for rash and skin lesion changes   Blood pressure 120/81, pulse 86, temperature 98.2 F (36.8 C), temperature source Oral, resp. rate 20, height _0  (1.676 m), weight 78.926 kg (174 lb), last menstrual period 10/06/2013, SpO2 100.00%.   Neurologic Examination:  General: NAD Mental Status: Alert, oriented, thought content appropriate.  Speech fluent without evidence of aphasia.  Able to follow 3 step commands without difficulty. Cranial Nerves: II: Discs flat bilaterally; Visual fields grossly normal, pupils equal, round, reactive to light and accommodation III,IV, VI: ptosis not present, extra-ocular motions intact bilaterally V,VII: smile symmetric, facial light touch sensation normal bilaterally VIII: hearing normal bilaterally IX,X: gag reflex present XI: bilateral shoulder shrug XII: midline tongue extension without atrophy or fasciculations  Motor: Right : Upper extremity   5/5    Left:     Upper extremity   5/5  Lower extremity   5/5     Lower extremity   5/5 Tone and bulk:normal tone throughout; no atrophy noted Sensory: Pinprick and light touch intact throughout, bilaterally Deep Tendon Reflexes:  Right: Upper Extremity   Left: Upper extremity   biceps (C-5 to C-6) 2/4   biceps (C-5 to C-6) 2/4 tricep (C7) 2/4    triceps (C7) 2/4 Brachioradialis (C6) 2/4  Brachioradialis (C6)  2/4  Lower Extremity Lower Extremity  quadriceps (L-2 to L-4) 2/4   quadriceps (L-2 to L-4) 2/4 Achilles (S1) 2/4   Achilles (S1) 2/4  Plantars: Right: downgoing   Left: downgoing Cerebellar: normal finger-to-nose,  normal heel-to-shin test Gait: normal.  CV: pulses palpable throughout    Lab Results: Basic Metabolic Panel:  Recent Labs Lab 10/10/13 1534  NA 138  K 3.7  CL 103  CO2 23  GLUCOSE 85  BUN 13  CREATININE 0.89  CALCIUM 9.7    Liver Function Tests:  Recent Labs Lab 10/10/13 1534  AST 23  ALT 36*  ALKPHOS 77  BILITOT 0.4  PROT 8.4*  ALBUMIN 3.9   No results found for this basename: LIPASE, AMYLASE,  in the last 168 hours No results found for this basename: AMMONIA,  in the last 168 hours  CBC:  Recent Labs Lab 10/10/13 1534  WBC 7.2  NEUTROABS 3.8  HGB 12.3  HCT 38.9  MCV 83.3  PLT 325    Cardiac Enzymes: No results found for this basename: CKTOTAL, CKMB, CKMBINDEX, TROPONINI,  in the last 168 hours  Lipid Panel: No results found for this basename: CHOL, TRIG, HDL, CHOLHDL, VLDL, LDLCALC,  in the last 168 hours  CBG:  Recent Labs Lab 10/10/13 1612  GLUCAP 66*    Microbiology: Results for orders placed during the hospital encounter of 06/08/13  CULTURE, BLOOD (ROUTINE X 2)     Status: None   Collection Time    06/08/13  2:44 AM      Result Value Ref Range Status   Specimen Description BLOOD LEFT ARM   Final   Special Requests BOTTLES DRAWN AEROBIC AND ANAEROBIC 10CC EACH   Final   Culture  Setup Time     Final   Value: 06/08/2013 13:46     Performed at [facility]   Culture     Final   Value: NO GROWTH 5 DAYS     Performed at [facility]   Report Status 06/14/2013 FINAL   Final  CULTURE, BLOOD (ROUTINE X 2)     Status: None   Collection Time    06/08/13  2:49 AM      Result Value Ref Range Status   Specimen Description BLOOD RIGHT ARM   Final   Special Requests BOTTLES DRAWN AEROBIC ONLY 10CC   Final    Culture  Setup Time     Final   Value:  06/08/2013 13:46     Performed at [facility]   Culture     Final   Value: NO GROWTH 5 DAYS     Performed at [facility]   Report Status 06/14/2013 FINAL   Final  RESPIRATORY VIRUS PANEL     Status: None   Collection Time    06/08/13 12:51 PM      Result Value Ref Range Status   Source - [facility]AN NASAL SWAB   Corrected   Comment: CORRECTED ON 06/08 AT 2018: PREVIOUSLY REPORTED AS NASAL SWAB   Respiratory Syncytial Virus A NOT DETECTED   Final   Respiratory Syncytial Virus B NOT DETECTED   Final   Influenza A NOT DETECTED   Final   Influenza B NOT DETECTED   Final   Parainfluenza 1 NOT DETECTED   Final   Parainfluenza 2 NOT DETECTED   Final   Parainfluenza 3 NOT DETECTED   Final   Metapneumovirus NOT DETECTED   Final   Rhinovirus NOT DETECTED   Final   Adenovirus NOT DETECTED   Final   Influenza A H1 NOT DETECTED   Final   Influenza A H3 NOT DETECTED   Final   Comment: (NOTE)           Normal Reference Range for each Analyte: NOT DETECTED     Testing performed using the Luminex xTAG Respiratory Viral Panel test     kit.     This test was developed and its performance characteristics determined     by [facility]. It has not been cleared or approved by the [place]     Food and Drug Administration. This test is used for clinical purposes.     It should not be regarded as investigational or for research. This     laboratory is certified under the [place] (CLIA) as qualified to perform high complexity     clinical laboratory testing.     Performed at [facility] PCR SCREENING     Status: None   Collection Time    06/09/13 12:34 AM      Result Value Ref Range Status   MRSA by PCR NEGATIVE  NEGATIVE Final   Comment:            The GeneXpert MRSA Assay (FDA     approved for NASAL specimens     only), is one component of a     comprehensive MRSA colonization      surveillance program. It is not     intended to diagnose MRSA     infection nor to guide or     monitor treatment for     MRSA infections.    Coagulation Studies:  Recent Labs  10/10/13 1534  LABPROT 13.6  INR 1.04    Imaging: No results found.  Assessment and plan per attending neurologist  Etta Quill PA-C [facility] [phone]  10/10/2013, 5:21 PM   Assessment/Plan:  38 y/o with history of frequent HA comes in with complains of worsening left parietal HA and right numbness-tingling and memory impairment. Neuro-exam non focal. MRI brain showed no acute intracranial abnormality and stable right cerebellar encephalomalacia, reportedly from remote cerebellar tumor resection. Doing better at this time after receiving IV reglan She said that she is averaging 2-3 severe HA weekly for which she has been taking tylenol almost daily. I believe her HA has features to suggest  episodic migraine and will require both preventive and abortive migraine management as outpatient. Strongly advised to discontinue the excessive use of analgesics to minimize the likelihood of medication overuse HA. She will be able to be discharge home with outpatient neurology follow up in 2-3 weeks.  Dorian Pod, MD [facility]

## 2013-10-10 NOTE — Discharge Instructions (Signed)
Headaches, Frequently Asked Questions Your testing today did not reveal anything dangerous or serious. No evidence of stroke, bleeding into your brain or new brain tumor. Take the pain medicine as prescribed for bad pain or Tylenol for mild pain. Followup with the family practice Center as needed MIGRAINE HEADACHES Q: What is migraine? What causes it? How can I treat it? A: Generally, migraine headaches begin as a dull ache. Then they develop into a constant, throbbing, and pulsating pain. You may experience pain at the temples. You may experience pain at the front or back of one or both sides of the head. The pain is usually accompanied by a combination of:  Nausea.  Vomiting.  Sensitivity to light and noise. Some people (about 15%) experience an aura (see below) before an attack. The cause of migraine is believed to be chemical reactions in the brain. Treatment for migraine may include over-the-counter or prescription medications. It may also include self-help techniques. These include relaxation training and biofeedback.  Q: What is an aura? A: About 15% of people with migraine get an "aura". This is a sign of neurological symptoms that occur before a migraine headache. You may see wavy or jagged lines, dots, or flashing lights. You might experience tunnel vision or blind spots in one or both eyes. The aura can include visual or auditory hallucinations (something imagined). It may include disruptions in smell (such as strange odors), taste or touch. Other symptoms include:  Numbness.  A "pins and needles" sensation.  Difficulty in recalling or speaking the correct word. These neurological events may last as long as 60 minutes. These symptoms will fade as the headache begins. Q: What is a trigger? A: Certain physical or environmental factors can lead to or "trigger" a migraine. These include:  Foods.  Hormonal changes.  Weather.  Stress. It is important to remember that triggers are  different for everyone. To help prevent migraine attacks, you need to figure out which triggers affect you. Keep a headache diary. This is a good way to track triggers. The diary will help you talk to your healthcare professional about your condition. Q: Does weather affect migraines? A: Bright sunshine, hot, humid conditions, and drastic changes in barometric pressure may lead to, or "trigger," a migraine attack in some people. But studies have shown that weather does not act as a trigger for everyone with migraines. Q: What is the link between migraine and hormones? A: Hormones start and regulate many of your body's functions. Hormones keep your body in balance within a constantly changing environment. The levels of hormones in your body are unbalanced at times. Examples are during menstruation, pregnancy, or menopause. That can lead to a migraine attack. In fact, about three quarters of all women with migraine report that their attacks are related to the menstrual cycle.  Q: Is there an increased risk of stroke for migraine sufferers? A: The likelihood of a migraine attack causing a stroke is very remote. That is not to say that migraine sufferers cannot have a stroke associated with their migraines. In persons under age 67, the most common associated factor for stroke is migraine headache. But over the course of a person's normal life span, the occurrence of migraine headache may actually be associated with a reduced risk of dying from cerebrovascular disease due to stroke.  Q: What are acute medications for migraine? A: Acute medications are used to treat the pain of the headache after it has started. Examples over-the-counter medications, NSAIDs, ergots, and  triptans.  Q: What are the triptans? A: Triptans are the newest class of abortive medications. They are specifically targeted to treat migraine. Triptans are vasoconstrictors. They moderate some chemical reactions in the brain. The triptans work  on receptors in your brain. Triptans help to restore the balance of a neurotransmitter called serotonin. Fluctuations in levels of serotonin are thought to be a main cause of migraine.  Q: Are over-the-counter medications for migraine effective? A: Over-the-counter, or "OTC," medications may be effective in relieving mild to moderate pain and associated symptoms of migraine. But you should see your caregiver before beginning any treatment regimen for migraine.  Q: What are preventive medications for migraine? A: Preventive medications for migraine are sometimes referred to as "prophylactic" treatments. They are used to reduce the frequency, severity, and length of migraine attacks. Examples of preventive medications include antiepileptic medications, antidepressants, beta-blockers, calcium channel blockers, and NSAIDs (nonsteroidal anti-inflammatory drugs). Q: Why are anticonvulsants used to treat migraine? A: During the past few years, there has been an increased interest in antiepileptic drugs for the prevention of migraine. They are sometimes referred to as "anticonvulsants". Both epilepsy and migraine may be caused by similar reactions in the brain.  Q: Why are antidepressants used to treat migraine? A: Antidepressants are typically used to treat people with depression. They may reduce migraine frequency by regulating chemical levels, such as serotonin, in the brain.  Q: What alternative therapies are used to treat migraine? A: The term "alternative therapies" is often used to describe treatments considered outside the scope of conventional Western medicine. Examples of alternative therapy include acupuncture, acupressure, and yoga. Another common alternative treatment is herbal therapy. Some herbs are believed to relieve headache pain. Always discuss alternative therapies with your caregiver before proceeding. Some herbal products contain arsenic and other toxins. TENSION HEADACHES Q: What is a  tension-type headache? What causes it? How can I treat it? A: Tension-type headaches occur randomly. They are often the result of temporary stress, anxiety, fatigue, or anger. Symptoms include soreness in your temples, a tightening band-like sensation around your head (a "vice-like" ache). Symptoms can also include a pulling feeling, pressure sensations, and contracting head and neck muscles. The headache begins in your forehead, temples, or the back of your head and neck. Treatment for tension-type headache may include over-the-counter or prescription medications. Treatment may also include self-help techniques such as relaxation training and biofeedback. CLUSTER HEADACHES Q: What is a cluster headache? What causes it? How can I treat it? A: Cluster headache gets its name because the attacks come in groups. The pain arrives with little, if any, warning. It is usually on one side of the head. A tearing or bloodshot eye and a runny nose on the same side of the headache may also accompany the pain. Cluster headaches are believed to be caused by chemical reactions in the brain. They have been described as the most severe and intense of any headache type. Treatment for cluster headache includes prescription medication and oxygen. SINUS HEADACHES Q: What is a sinus headache? What causes it? How can I treat it? A: When a cavity in the bones of the face and skull (a sinus) becomes inflamed, the inflammation will cause localized pain. This condition is usually the result of an allergic reaction, a tumor, or an infection. If your headache is caused by a sinus blockage, such as an infection, you will probably have a fever. An x-ray will confirm a sinus blockage. Your caregiver's treatment might include antibiotics for  the infection, as well as antihistamines or decongestants.  REBOUND HEADACHES Q: What is a rebound headache? What causes it? How can I treat it? A: A pattern of taking acute headache medications too  often can lead to a condition known as "rebound headache." A pattern of taking too much headache medication includes taking it more than 2 days per week or in excessive amounts. That means more than the label or a caregiver advises. With rebound headaches, your medications not only stop relieving pain, they actually begin to cause headaches. Doctors treat rebound headache by tapering the medication that is being overused. Sometimes your caregiver will gradually substitute a different type of treatment or medication. Stopping may be a challenge. Regularly overusing a medication increases the potential for serious side effects. Consult a caregiver if you regularly use headache medications more than 2 days per week or more than the label advises. ADDITIONAL QUESTIONS AND ANSWERS Q: What is biofeedback? A: Biofeedback is a self-help treatment. Biofeedback uses special equipment to monitor your body's involuntary physical responses. Biofeedback monitors:  Breathing.  Pulse.  Heart rate.  Temperature.  Muscle tension.  Brain activity. Biofeedback helps you refine and perfect your relaxation exercises. You learn to control the physical responses that are related to stress. Once the technique has been mastered, you do not need the equipment any more. Q: Are headaches hereditary? A: Four out of five (80%) of people that suffer report a family history of migraine. Scientists are not sure if this is genetic or a family predisposition. Despite the uncertainty, a child has a 50% chance of having migraine if one parent suffers. The child has a 75% chance if both parents suffer.  Q: Can children get headaches? A: By the time they reach high school, most young people have experienced some type of headache. Many safe and effective approaches or medications can prevent a headache from occurring or stop it after it has begun.  Q: What type of doctor should I see to diagnose and treat my headache? A: Start with  your primary caregiver. Discuss his or her experience and approach to headaches. Discuss methods of classification, diagnosis, and treatment. Your caregiver may decide to recommend you to a headache specialist, depending upon your symptoms or other physical conditions. Having diabetes, allergies, etc., may require a more comprehensive and inclusive approach to your headache. The National Head[employer] Foundation will provide, upon request, a list of [facility] physician members in your state. Document Released: 03/11/2003 Document Revised: 03/13/2011 Document Reviewed: 08/19/2007 [facility] Patient Information 2015 [place], [place]. This information is not intended to replace advice given to you by your health care provider. Make sure you discuss any questions you have with your health care provider.

## 2013-10-10 NOTE — ED Notes (Signed)
The pt has no facial droop no arm drift strong grips bi-laterally steady gait

## 2013-10-10 NOTE — ED Notes (Signed)
Visitor at bedside brought food before stroke swallow screen completed.

## 2013-10-10 NOTE — ED Provider Notes (Signed)
CSN: [id number]     Arrival date & time 10/10/13  1511 History   First MD Initiated Contact with Patient 10/10/13 1554     Chief Complaint  Patient presents with  . Numbness     (Consider location/radiation/quality/duration/timing/severity/associated sxs/prior Treatment) HPI Patient awakened 10 AM today with left-sided parietal headache and numbness in her right hand. She denies difficulty using her hand. She denies arm numbness headache is constant. She gets headaches approximately twice per week no headaches are different than this, usually diffuse. She denies visual complaint. Denies difficulty speaking. Seen at family practice Center earlier today sent here for further evaluation. No treatment prior to coming here. Nothing makes headache is worse. Numbness is constant as well. Headache is sharp and severe Past Medical History  Diagnosis Date  . Cerebellar tumor 2005    s/p surgical resection and radiation in [place]  . Allergy   . Lumbar arthropathy 2011    Left paracentral disc protrusion at L5-S1 with potential contact of the S1 nerve roots bilaterally, more likely on the left   Past Surgical History  Procedure Laterality Date  . Tonsillectomy  1985  . Exploratory laparotomy  2009    Laparoscopy revealed lack of ovaries bilaterally in [place]  . Brain surgery  2005    Cerebellar tumor resection  . Cerebellar tumor  2005    Dx and resected in 2005   Family History  Problem Relation Age of Onset  . Lumbar disc disease Mother   . Hypertension Father   . Diabetes Neg Hx   . Cancer Neg Hx   . Birth defects Neg Hx    History  Substance Use Topics  . Smoking status: Never Smoker   . Smokeless tobacco: Never Used  . Alcohol Use: No   OB History   Grav Para Term Preterm Abortions TAB SAB Ect Mult Living                 Review of Systems  Constitutional: Negative.   Respiratory: Negative.   Cardiovascular: Negative.   Gastrointestinal: Negative.   Musculoskeletal:  Negative.   Skin: Negative.   Neurological: Positive for numbness and headaches.  Psychiatric/Behavioral: Negative.   All other systems reviewed and are negative.     Allergies  Bactrim and Pork-derived products  Home Medications   Prior to Admission medications   Medication Sig Start Date End Date Taking? Authorizing Provider  diphenhydrAMINE (BENADRYL) 25 mg capsule Take 1 capsule (25 mg total) by mouth every 6 (six) hours as needed. 06/10/13   Nobie Putnam, DO  ferrous sulfate 325 (65 FE) MG EC tablet Take 1 tablet (325 mg total) by mouth daily with breakfast. 06/10/13   Nobie Putnam, DO  neomycin-polymyxin-hydrocortisone (CORTISPORIN) otic solution Place 3 drops into the left ear 4 (four) times daily. 05/27/13   Dickie La, MD  Saline 0.9 % AERS Place 1 spray into the nose 2 (two) times daily as needed. 06/10/13   Nobie Putnam, DO   BP 117/75  Pulse 90  Temp(Src) 98.2 F (36.8 C) (Oral)  Resp 18  Ht 5\' 6"  (1.676 m)  Wt 174 lb (78.926 kg)  BMI 28.10 kg/m2  SpO2 100%  LMP 10/06/2013 Physical Exam  Nursing note and vitals reviewed. Constitutional: She is oriented to person, place, and time. She appears well-developed and well-nourished.  HENT:  Head: Normocephalic and atraumatic.  Eyes: Conjunctivae are normal. Pupils are equal, round, and reactive to light.  Neck: Neck supple. No tracheal  deviation present. No thyromegaly present.  Cardiovascular: Normal rate and regular rhythm.   No murmur heard. Pulmonary/Chest: Effort normal and breath sounds normal.  Abdominal: Soft. Bowel sounds are normal. She exhibits no distension. There is no tenderness.  Musculoskeletal: Normal range of motion. She exhibits no edema and no tenderness.  Neurological: She is alert and oriented to person, place, and time. She has normal reflexes. No cranial nerve deficit. Coordination normal.  DTRs symmetric bilaterally knee jerk and ankle jerk biceps toes are bilaterally.  Gait normal Romberg normal finger to nose normal  Skin: Skin is warm and dry. No rash noted.  Psychiatric: She has a normal mood and affect.    ED Course  Procedures (including critical care time) Labs Review Labs Reviewed  CBC WITH DIFFERENTIAL  COMPREHENSIVE METABOLIC PANEL  PROTIME-INR  URINALYSIS, ROUTINE W REFLEX MICROSCOPIC  PREGNANCY, URINE    Imaging Review No results found.   EKG Interpretation None      Date: 10/10/2013  Rate: 75  Rhythm: normal sinus rhythm  QRS Axis: normal  Intervals: normal  ST/T Wave abnormalities: normal  Conduction Disutrbances: none  Narrative Interpretation: unremarkable   Spoke with Dr. Doy Mince from neurology who will evaluate patient  7:10 PM patient states numbness in right hand has resolved. She now complains of diffuse headache. She did receive transient relief of her headache after treatment with intravenous Reglan. Percocet ordered   MDM   Results for orders placed during the hospital encounter of 10/10/13  CBC WITH DIFFERENTIAL      Result Value Ref Range   WBC 7.2  4.0 - 10.5 K/uL   RBC 4.67  3.87 - 5.11 MIL/uL   Hemoglobin 12.3  12.0 - 15.0 g/dL   HCT 38.9  36.0 - 46.0 %   MCV 83.3  78.0 - 100.0 fL   MCH 26.3  26.0 - 34.0 pg   MCHC 31.6  30.0 - 36.0 g/dL   RDW 14.7  11.5 - 15.5 %   Platelets 325  150 - 400 K/uL   Neutrophils Relative % 53  43 - 77 %   Neutro Abs 3.8  1.7 - 7.7 K/uL   Lymphocytes Relative 38  12 - 46 %   Lymphs Abs 2.7  0.7 - 4.0 K/uL   Monocytes Relative 7  3 - 12 %   Monocytes Absolute 0.5  0.1 - 1.0 K/uL   Eosinophils Relative 2  0 - 5 %   Eosinophils Absolute 0.1  0.0 - 0.7 K/uL   Basophils Relative 0  0 - 1 %   Basophils Absolute 0.0  0.0 - 0.1 K/uL  COMPREHENSIVE METABOLIC PANEL      Result Value Ref Range   Sodium 138  137 - 147 mEq/L   Potassium 3.7  3.7 - 5.3 mEq/L   Chloride 103  96 - 112 mEq/L   CO2 23  19 - 32 mEq/L   Glucose, Bld 85  70 - 99 mg/dL   BUN 13  6 - 23 mg/dL    Creatinine, Ser 0.89  0.50 - 1.10 mg/dL   Calcium 9.7  8.4 - 10.5 mg/dL   Total Protein 8.4 (*) 6.0 - 8.3 g/dL   Albumin 3.9  3.5 - 5.2 g/dL   AST 23  0 - 37 U/L   ALT 36 (*) 0 - 35 U/L   Alkaline Phosphatase 77  39 - 117 U/L   Total Bilirubin 0.4  0.3 - 1.2 mg/dL   GFR calc  non Af Amer 81 (*) >90 mL/min   GFR calc Af Amer >90  >90 mL/min   Anion gap 12  5 - 15  PROTIME-INR      Result Value Ref Range   Prothrombin Time 13.6  11.6 - 15.2 seconds   INR 1.04  0.00 - 1.49  URINALYSIS, ROUTINE W REFLEX MICROSCOPIC      Result Value Ref Range   Color, Urine YELLOW  YELLOW   APPearance CLEAR  CLEAR   Specific Gravity, Urine 1.023  1.005 - 1.030   pH 6.5  5.0 - 8.0   Glucose, UA NEGATIVE  NEGATIVE mg/dL   Hgb urine dipstick MODERATE (*) NEGATIVE   Bilirubin Urine NEGATIVE  NEGATIVE   Ketones, ur NEGATIVE  NEGATIVE mg/dL   Protein, ur 30 (*) NEGATIVE mg/dL   Urobilinogen, UA 1.0  0.0 - 1.0 mg/dL   Nitrite NEGATIVE  NEGATIVE   Leukocytes, UA TRACE (*) NEGATIVE  PREGNANCY, URINE      Result Value Ref Range   Preg Test, Ur NEGATIVE  NEGATIVE  URINE RAPID DRUG SCREEN (HOSP PERFORMED)      Result Value Ref Range   Opiates NONE DETECTED  NONE DETECTED   Cocaine NONE DETECTED  NONE DETECTED   Benzodiazepines NONE DETECTED  NONE DETECTED   Amphetamines NONE DETECTED  NONE DETECTED   Tetrahydrocannabinol NONE DETECTED  NONE DETECTED   Barbiturates NONE DETECTED  NONE DETECTED  URINE MICROSCOPIC-ADD ON      Result Value Ref Range   Squamous Epithelial / LPF FEW (*) RARE   WBC, UA 0-2  <3 WBC/hpf   RBC / HPF 3-6  <3 RBC/hpf   Bacteria, UA MANY (*) RARE   Urine-Other MUCOUS PRESENT    CBG MONITORING, ED      Result Value Ref Range   Glucose-Capillary 66 (*) 70 - 99 mg/dL   Mr Brain Wo Contrast  10/10/2013   CLINICAL DATA:  38 year old female personal history of cerebellar tumor resection. Presents with episode of acute memory loss, confusion plus right upper extremity numbness and  tingling. Initial encounter.  EXAM: MRI HEAD WITHOUT CONTRAST  TECHNIQUE: Multiplanar, multiecho pulse sequences of the brain and surrounding structures were obtained without intravenous contrast.  COMPARISON:  Head CT without contrast 06/2012 and earlier.  FINDINGS: On prior CTs there was suggestion of remote occipital craniotomy changes. There is midline scalp soft tissue scarring over the occiput seen today to reinforce this. There is chronic encephalomalacia in the posterior and medial cerebellum, mildly affecting the vermis. No associated mass effect. Associated hemosiderin.  Elsewhere cerebral volume is within normal limits. Partially empty sella. No restricted diffusion to suggest acute infarction. No midline shift, mass effect, evidence of mass lesion, ventriculomegaly, extra-axial collection or acute intracranial hemorrhage. Cervicomedullary junction within normal limits. Negative visualized cervical spine. Normal bone marrow signal. Major intracranial vascular flow voids are preserved.  Outside of the cerebellum minimal scattered subcortical cerebral white matter T2 and FLAIR hyperintensity, nonspecific. There is a chronic micro hemorrhage in the left periatrial white matter on series 9, image 16. Deep gray matter nuclei and brainstem are within normal limits.  Visible internal auditory structures appear normal. Visualized paranasal sinuses and mastoids are clear. Visualized orbit soft tissues are within normal limits. Visualized scalp soft tissues are within normal limits.  IMPRESSION: 1.  No acute intracranial abnormality. 2. Stable right cerebellar encephalomalacia, reportedly from remote cerebellar tumor section. 3. Mild hemispheric signal changes, nonspecific.   Electronically Signed  By: Lars Pinks M.D.   On: 10/10/2013 19:05    Dr. Aram Beecham evaluate patient in ED and did not feel further diagnostic workup as needed. Symptoms are consistent with migraine Plan followup family practice Center as  needed Prescription Percocet Diagnosis headache Final diagnoses:  None        Orlie Dakin, MD 10/10/13 [phone]

## 2013-10-10 NOTE — ED Notes (Signed)
The pt reports that she also felt confused this am  lmp now

## 2013-10-10 NOTE — Assessment & Plan Note (Addendum)
Concern for TIA vs stroke vs recurrence of intracranial tumor (hx of cerebellar tumor s/p resection) Hemodynamically stable  MMSE 28/30 No deficits appreciated on exam (apart from word finding difficulties) Given hx and presentation concerning for acute pathology  Does have hx of soft plaques bilaterally but would be very rare for bilat occlusion  Has had similar presentation in the past with the following imaging  MRI 2012: encephalomalacia  CT head 2014: chronic post surg changes no vascular pathology Sent to the ED; report given to triage Could also consider cardiac w/up for TIA risk factor including EKG/holter Last ECHO in 2014 If w/up negative would refer to neuro or neuropsych for cognitive testing

## 2014-06-04 ENCOUNTER — Ambulatory Visit: Payer: Self-pay

## 2014-06-04 ENCOUNTER — Ambulatory Visit: Payer: No Typology Code available for payment source

## 2014-08-04 ENCOUNTER — Ambulatory Visit: Payer: Self-pay

## 2014-08-17 ENCOUNTER — Ambulatory Visit (INDEPENDENT_AMBULATORY_CARE_PROVIDER_SITE_OTHER): Payer: Self-pay | Admitting: Family Medicine

## 2014-08-17 VITALS — BP 120/74 | HR 90 | Temp 98.2°F | Ht 66.0 in | Wt 177.7 lb

## 2014-08-17 DIAGNOSIS — G458 Other transient cerebral ischemic attacks and related syndromes: Secondary | ICD-10-CM

## 2014-08-17 DIAGNOSIS — N970 Female infertility associated with anovulation: Secondary | ICD-10-CM

## 2014-08-17 NOTE — Patient Instructions (Signed)
Thank you so much for coming to visit me today! Unfortunately, given your history of TIA (small clots in the brain that go away on their own), we cannot give you a prescription for any medication with estrogen in it. History of TIA increases your risk of stroke and this medication would increase that risk even more! You may add soy to your diet, which has some natural estrogen in it and is shown to not increase your risk of stroke. Black Cohosh has also been shown to help.  Please continue to take Naproxen as needed for back pain. You may also try Salonpas patches.  Thanks again and please let me know if there's anything else I can do for you! Dr. Gerlean Ren

## 2014-08-21 NOTE — Assessment & Plan Note (Signed)
-   Requests refill of Climen, a form of birth control not sold in [place] - Will not fill Climen given history of suspected TIA. Increased risk of clot with birth control. Prescription not given and recommended against use - Anticipate will transition into Menopause once birth control discontinued - Recommended soy products and black cohosh to help with anticipated menopause symptoms. Instructed to return if symptoms are severe.

## 2014-08-21 NOTE — Assessment & Plan Note (Signed)
-   Recommended restarting Aspirin 81mg

## 2014-08-21 NOTE — Progress Notes (Signed)
Subjective:     Patient ID: Sandra Hahn, female   DOB: [birth year], 39 y.o.   MRN: [id number]  HPI Sandra Hahn is a 39yo female presenting today for refill on medication. - Reports history of brain tumor more than ten years ago. Had surgery, chemo, and radiation. Cancer is now resolved. - States radiation "fried" her ovaries. Was placed on medication so she would continue to have periods - LMP last month, unable to remember date - Has never been pregnant - Would like refill of Climen, a medication that appears to include Estradiol valerate and Cyproterone acetate. Medication not available in [place]. Requests printed prescription that she can send to her husband for him to pick up in [place]. States she has gotten this medication up until now in that country but they are now wanting a prescription. - History of TIA noted. Denies that she really had a stroke since CT was clear. - Chart review shows on 06/19/12 she presented with transient right arm numbness with distribution too diffuse to be considered distal neuropathy. CT head and carotid dopplers were obtained and showed chronic post-surgical changes but no vascular pathology. Again presented on 10/10/13 with word finding difficulties, left-sided parietal headache, and numbness in right hand. - Has not been taking Aspirin as prescribed  Review of Systems  Musculoskeletal: Positive for back pain.  All other systems reviewed and are negative.     Objective:   Physical Exam  Constitutional: She appears well-developed and well-nourished. No distress.  Cardiovascular: Normal rate and regular rhythm.  Exam reveals no gallop and no friction rub.   No murmur heard. Pulmonary/Chest: Effort normal. No respiratory distress. She has no wheezes.  Psychiatric: She has a normal mood and affect. Her behavior is normal.       Assessment:     Please refer to Problem List for Assessment.    Plan:     Please refer to Problem List for Plan.

## 2014-09-22 ENCOUNTER — Ambulatory Visit (INDEPENDENT_AMBULATORY_CARE_PROVIDER_SITE_OTHER): Payer: Self-pay | Admitting: Family Medicine

## 2014-09-22 ENCOUNTER — Encounter: Payer: Self-pay | Admitting: Family Medicine

## 2014-09-22 VITALS — BP 110/85 | HR 89 | Temp 98.3°F | Wt 179.0 lb

## 2014-09-22 DIAGNOSIS — H9202 Otalgia, left ear: Secondary | ICD-10-CM

## 2014-09-22 DIAGNOSIS — H9192 Unspecified hearing loss, left ear: Secondary | ICD-10-CM

## 2014-09-22 NOTE — Assessment & Plan Note (Signed)
Patient's signs and symptoms are most consistent with cerumen impaction. Unfortunately this issues was unable to be resolved with irrigation. On examination after irrigation I appreciated some erythematous changes to the surface of the canal; appearance resembling healing epithelium. Patient denied trauma or foreign objects (including fingers) in her canal. Patient DID state that ~44yr ago she saw a doctor from her home country who inserted forceps into her ear for cerumen impaction. This was quite painful and upon removal patient's husband stated there had been blood on the forceps. My concern is for previous TM rupture and possibly what I was seeing was scar tissue. However, I feel as though this should have healed more cleanly by this time. I am not comfortable pushing this beyond this point. - Referral to ENT placed.

## 2014-09-22 NOTE — Progress Notes (Signed)
   HPI  CC: Left ear hearing issues. Patient is here today for SDA for left ear hearing issues. She states that she has been dealing with this issue for well over a year. No recent trauma/injury to this area. No recent symptomatic changes. She has had a history of cerumen impaction in the past and states she has not had her ears irrigated for >2 years. She denies any drainage or pain. She has not used Q-tips inside the ear b/c she was told not to, and she denies any other foreign objects entering the canal.   (Left Ear Irrigation)  Patient reports no change in symptoms. Experienced some pain with curette use. Patient is concerned that this might be a permanent issue.   ROS: She denies vertigo, dizziness, deafness, nausea, vision problems, or pain with chewing  Objective: BP 110/85 mmHg  Pulse 89  Temp(Src) 98.3 F (36.8 C) (Oral)  Wt 179 lb (81.194 kg) Gen: NAD, alert, cooperative, and pleasant. HEENT: NCAT, EOMI, PERRL. No nystagmus. Lt TM clear w/ normal cone of light; minimal cerumen. Rt TM unable to be seen 2/2 to (what appears like) light colored cerumen. After irrigation cerumen at the deep aspect of the canal was unable to be expelled. Some at the canal wall was able to be lifted and removed. Some erythematous changes noted beneath this substance and moderate pain w/ curette was appreciated and irrigation was discontinued.  Ext: No edema, warm Neuro: Alert and oriented, Speech clear, No gross deficits.  Assessment and plan:  Hearing loss Patient's signs and symptoms are most consistent with cerumen impaction. Unfortunately this issues was unable to be resolved with irrigation. On examination after irrigation I appreciated some erythematous changes to the surface of the canal; appearance resembling healing epithelium. Patient denied trauma or foreign objects (including fingers) in her canal. Patient DID state that ~72yr ago she saw a doctor from her home country who inserted forceps  into her ear for cerumen impaction. This was quite painful and upon removal patient's husband stated there had been blood on the forceps. My concern is for previous TM rupture and possibly what I was seeing was scar tissue. However, I feel as though this should have healed more cleanly by this time. I am not comfortable pushing this beyond this point. - Referral to ENT placed.    Orders Placed This Encounter  Procedures  . Ambulatory referral to ENT    Referral Priority:  Routine    Referral Type:  Consultation    Referral Reason:  Specialty Services Required    Requested Specialty:  Otolaryngology    Number of Visits Requested:  1    Elberta Leatherwood, MD,MS,  PGY2 09/22/2014 8:57 PM

## 2014-09-22 NOTE — Patient Instructions (Signed)
It was a pleasure seeing you today in our clinic. Today we discussed your left ear pain and hearing loss. Here is the treatment plan we have discussed and agreed upon together:   - We attempted to wash out this effected ear. Unfortunately we couldn't get much substance out with this method. - Because of the history you reported to me involving this left ear I believe it is appropriate to send a referral to an Ear, Nose, Throat (ENT) Specialist. You will be contacted later this week to set this appointment up. - If you have any fevers, chills, HA, dizziness, vertigo, nausea, or vomiting please don't hesitate to report to the nearest urgent care or emergency room.

## 2015-04-28 ENCOUNTER — Encounter: Payer: Self-pay | Admitting: Family Medicine

## 2015-04-28 ENCOUNTER — Ambulatory Visit (INDEPENDENT_AMBULATORY_CARE_PROVIDER_SITE_OTHER): Payer: Self-pay | Admitting: Family Medicine

## 2015-04-28 DIAGNOSIS — M5136 Other intervertebral disc degeneration, lumbar region: Secondary | ICD-10-CM

## 2015-04-28 MED ORDER — CYCLOBENZAPRINE HCL 10 MG PO TABS: 10.0000 mg | ORAL_TABLET | Freq: Three times a day (TID) | ORAL | Status: AC | PRN

## 2015-04-28 MED ORDER — MELOXICAM 15 MG PO TABS: 15.0000 mg | ORAL_TABLET | Freq: Every day | ORAL | Status: AC

## 2015-04-28 MED FILL — MELOXICAM 15 MG TABLET: 15 | 30 days supply | Qty: 30 | Fill #0

## 2015-04-28 MED FILL — CYCLOBENZAPRINE 10 MG TAB: 10 | 17 days supply | Qty: 50 | Fill #0

## 2015-04-28 NOTE — Assessment & Plan Note (Addendum)
Flair of back pain of 3 days duration. NSAIDs and muscle relaxers. I am unclear the role of emotion and/or secondary gain in these complaints.  I took her complaints at face value and tried to meet her needs.  I did ask to FU with PCP in one week if not significant improving.  PCP may have a better contextual understanding of this patient.

## 2015-04-28 NOTE — Patient Instructions (Signed)
I sent in two prescriptions. Meloxicam is similar to Aleve but stronger.  It helps with pain and inflammation. Cyclobenzaprine is a muscle relaxer to break up the spasm See Dr. Gerlean Ren next week if you are not making good improvement.  She might start you on some physical therapy. Don't forget to do the gentle stretching exercises.

## 2015-04-28 NOTE — Progress Notes (Signed)
   Subjective:    Patient ID: Sandra Hahn, female    DOB: January 22, 1975, 40 y.o.   MRN: [id number]  HPI 40 yo female with hx of lumbar degenerative disc disease presents with 3 days of severe midline/left sided lumbar back pain radiating to left hip. States she has not felt well for two weeks.  Started with respiratory illness with cough and fever, which have now resolved.  As the resp illness was improving, she had strong flair of her chronic back pain.  Endorses several positives including headache and leg weakness.  States her Aleve is not working.  Rates pain as 9/10.  Denies fever, urgency, frequency, dysuria.  States there is no concern for pregnancy.       Review of Systems     Objective:   Physical Exam Lungs clear Abd benign Lumbar spasm Left leg seems voluntarily week.   Affect, very passive, dependant and visably grimacing throughout.   Asks for pain medication in the office.  Also see letter that she asked that I provide.      Assessment & Plan:

## 2016-03-13 IMAGING — CR DG HIP (WITH OR WITHOUT PELVIS) 2-3V*L*
3 series · 3 of 3 positions shown · non-contrast
Comparison: None.

CLINICAL DATA: hip pain, decreased ROM.

EXAM:
LEFT HIP - COMPLETE 2+ VIEW

[t pelvis ap]
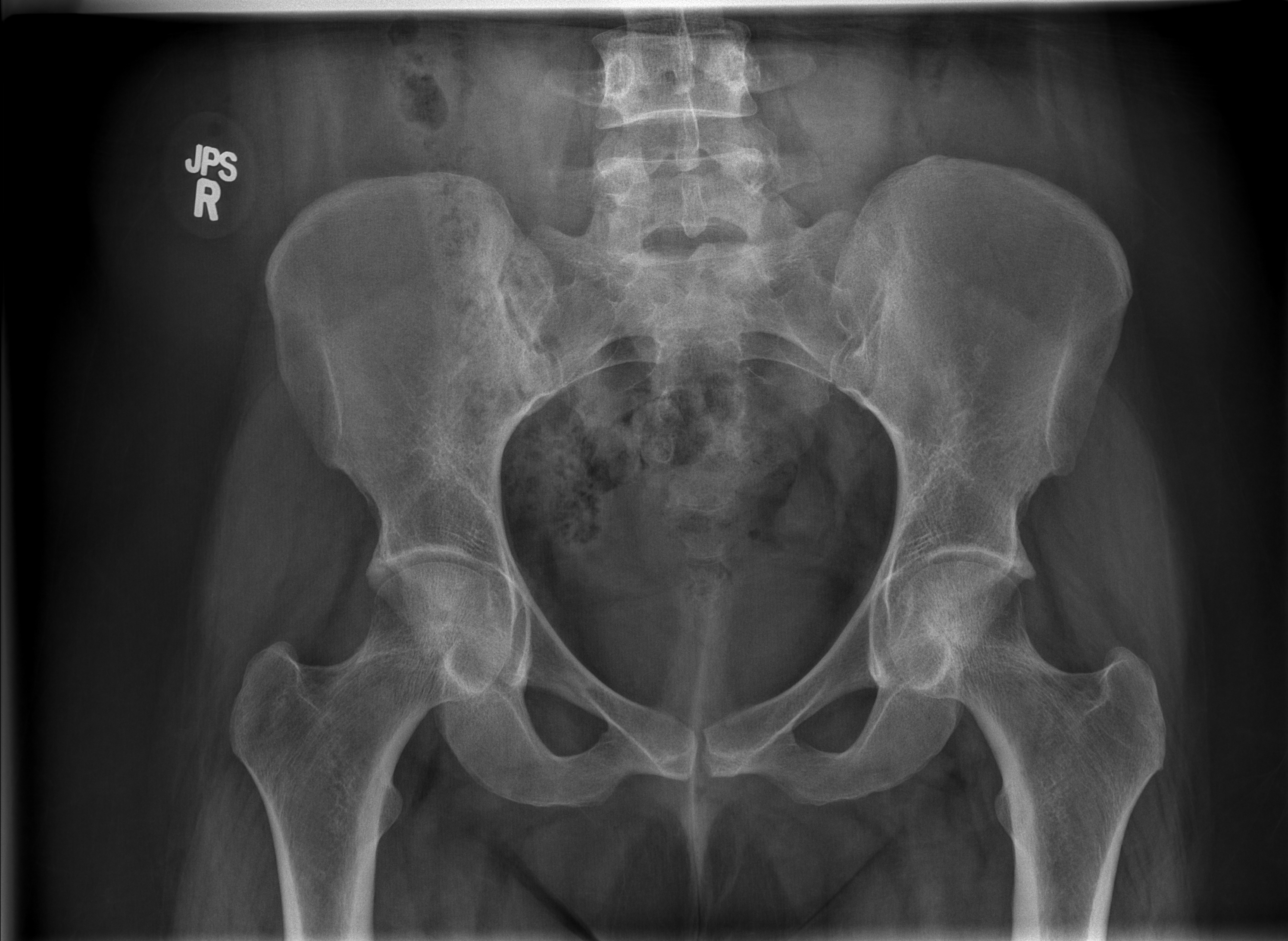

[t hip ap left]
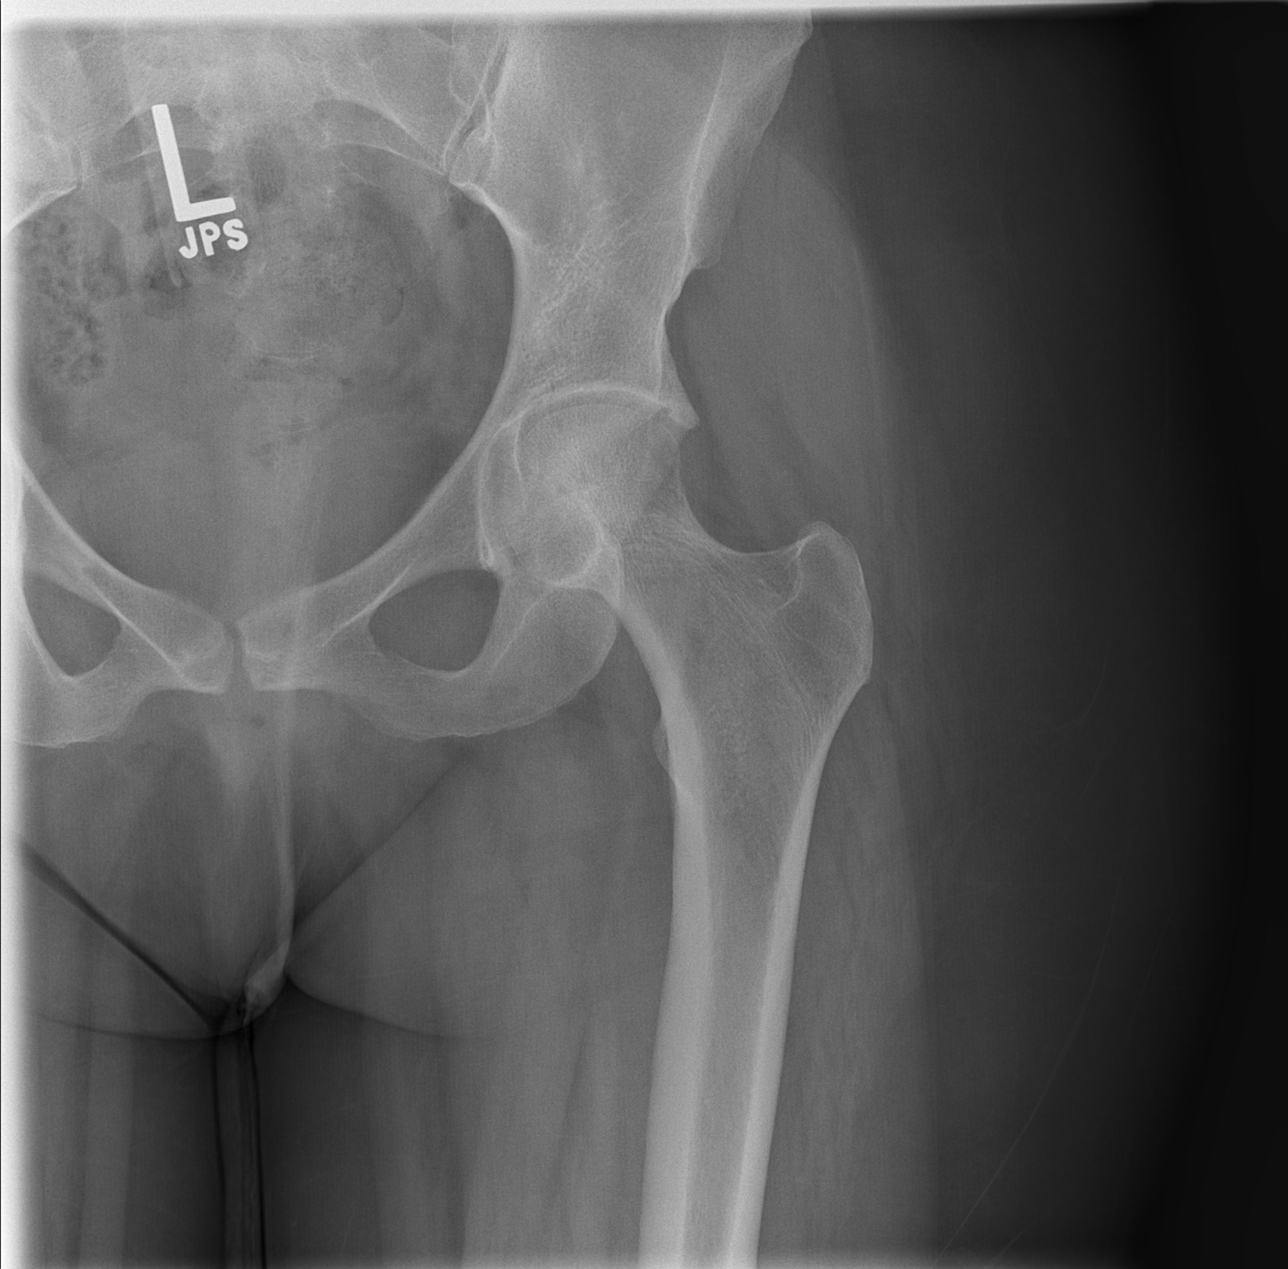

[t hip frog leg left]
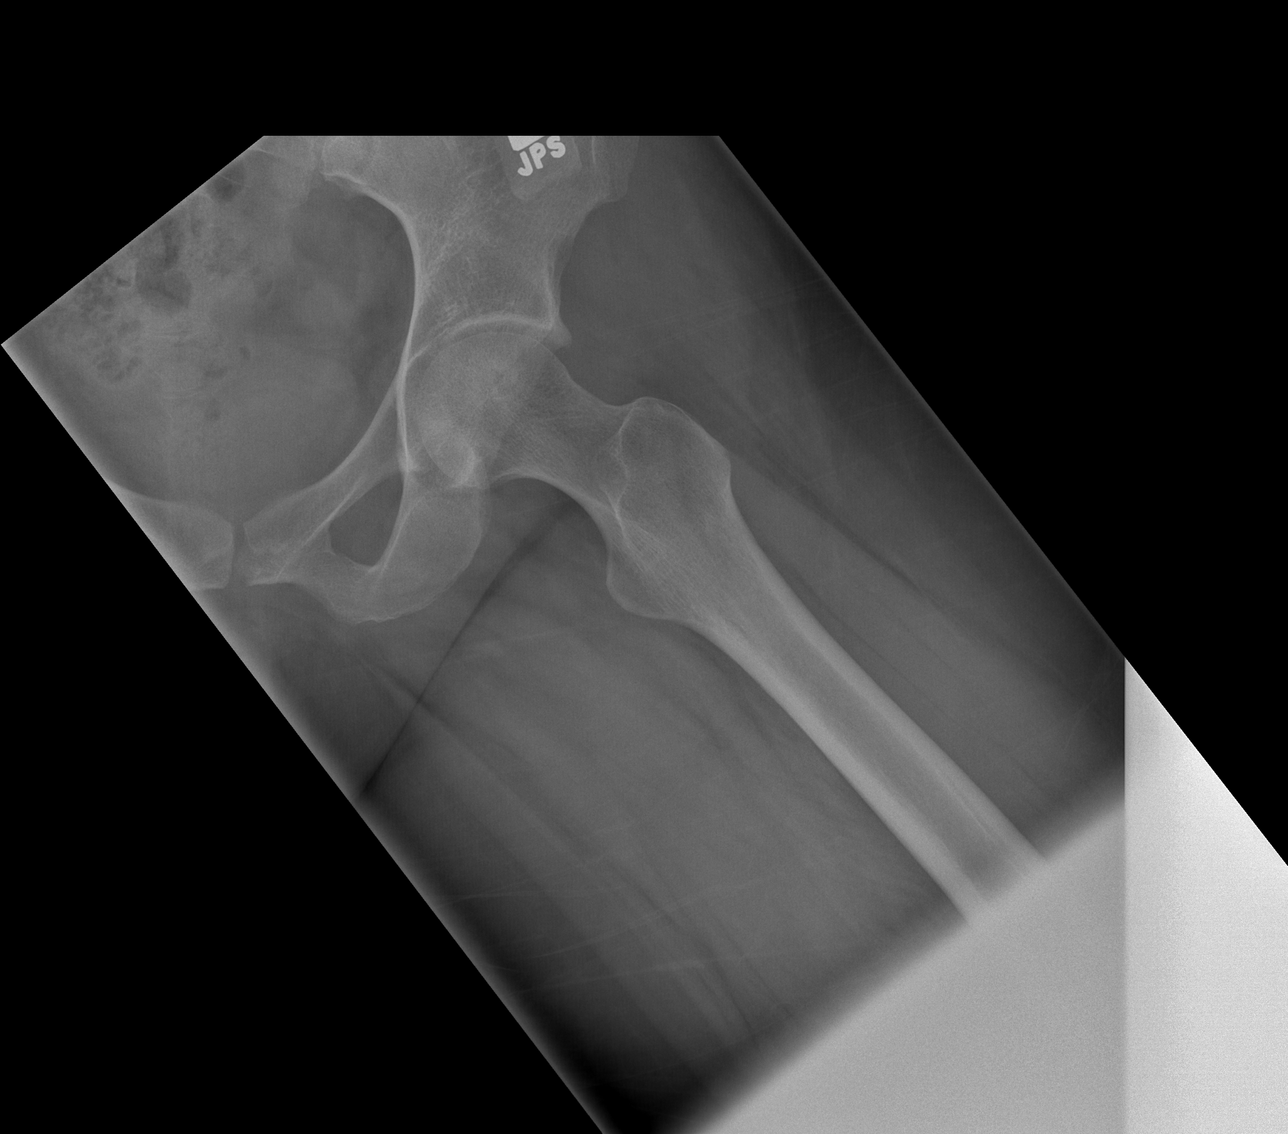

[3 of 3 positions shown; findings below may reference images not displayed]

FINDINGS: There is no evidence of hip fracture or dislocation. There is no
evidence of arthropathy or other focal bone abnormality.
IMPRESSION: Negative.
